# Patient Record
Sex: Female | Born: 1965 | Race: White | Hispanic: No | Marital: Married | Smoking: Never smoker
Health system: Southern US, Community
[De-identification: ages and names within clinical notes are randomized; demographics above are authoritative.]

## PROBLEM LIST (undated history)

## (undated) DIAGNOSIS — K219 Gastro-esophageal reflux disease without esophagitis: Secondary | ICD-10-CM

## (undated) DIAGNOSIS — Z09 Encounter for follow-up examination after completed treatment for conditions other than malignant neoplasm: Secondary | ICD-10-CM

## (undated) DIAGNOSIS — D499 Neoplasm of unspecified behavior of unspecified site: Secondary | ICD-10-CM

## (undated) DIAGNOSIS — L439 Lichen planus, unspecified: Secondary | ICD-10-CM

## (undated) DIAGNOSIS — R0602 Shortness of breath: Secondary | ICD-10-CM

## (undated) DIAGNOSIS — R600 Localized edema: Secondary | ICD-10-CM

## (undated) DIAGNOSIS — R030 Elevated blood-pressure reading, without diagnosis of hypertension: Secondary | ICD-10-CM

## (undated) DIAGNOSIS — R200 Anesthesia of skin: Secondary | ICD-10-CM

## (undated) DIAGNOSIS — E039 Hypothyroidism, unspecified: Secondary | ICD-10-CM

## (undated) DIAGNOSIS — F32A Depression, unspecified: Secondary | ICD-10-CM

## (undated) DIAGNOSIS — R0601 Orthopnea: Secondary | ICD-10-CM

## (undated) DIAGNOSIS — F329 Major depressive disorder, single episode, unspecified: Secondary | ICD-10-CM

## (undated) HISTORY — DX: Depression, unspecified: F32.A

## (undated) HISTORY — PX: TUBAL LIGATION: SHX77

## (undated) HISTORY — DX: Localized edema: R60.0

## (undated) HISTORY — DX: Orthopnea: R06.01

## (undated) HISTORY — DX: Neoplasm of unspecified behavior of unspecified site: D49.9

## (undated) HISTORY — DX: Anesthesia of skin: R20.0

## (undated) HISTORY — DX: Encounter for follow-up examination after completed treatment for conditions other than malignant neoplasm: Z09

## (undated) HISTORY — DX: Major depressive disorder, single episode, unspecified: F32.9

## (undated) HISTORY — DX: Elevated blood-pressure reading, without diagnosis of hypertension: R03.0

## (undated) HISTORY — DX: Hypothyroidism, unspecified: E03.9

## (undated) HISTORY — DX: Lichen planus, unspecified: L43.9

## (undated) HISTORY — DX: Shortness of breath: R06.02

## (undated) HISTORY — DX: Gastro-esophageal reflux disease without esophagitis: K21.9

---

## 2016-01-29 DIAGNOSIS — F4310 Post-traumatic stress disorder, unspecified: Secondary | ICD-10-CM | POA: Diagnosis not present

## 2016-02-05 DIAGNOSIS — F331 Major depressive disorder, recurrent, moderate: Secondary | ICD-10-CM | POA: Diagnosis not present

## 2016-02-16 ENCOUNTER — Ambulatory Visit (INDEPENDENT_AMBULATORY_CARE_PROVIDER_SITE_OTHER): Payer: BLUE CROSS/BLUE SHIELD | Admitting: Family Medicine

## 2016-02-16 VITALS — BP 142/88 | HR 72 | Temp 98.6°F | Resp 16 | Ht 62.0 in | Wt 151.4 lb

## 2016-02-16 DIAGNOSIS — Z0000 Encounter for general adult medical examination without abnormal findings: Secondary | ICD-10-CM | POA: Diagnosis not present

## 2016-02-16 DIAGNOSIS — Z131 Encounter for screening for diabetes mellitus: Secondary | ICD-10-CM | POA: Diagnosis not present

## 2016-02-16 DIAGNOSIS — Z23 Encounter for immunization: Secondary | ICD-10-CM | POA: Diagnosis not present

## 2016-02-16 DIAGNOSIS — Z1211 Encounter for screening for malignant neoplasm of colon: Secondary | ICD-10-CM

## 2016-02-16 DIAGNOSIS — N898 Other specified noninflammatory disorders of vagina: Secondary | ICD-10-CM

## 2016-02-16 DIAGNOSIS — Z13220 Encounter for screening for lipoid disorders: Secondary | ICD-10-CM | POA: Diagnosis not present

## 2016-02-16 DIAGNOSIS — F329 Major depressive disorder, single episode, unspecified: Secondary | ICD-10-CM | POA: Diagnosis not present

## 2016-02-16 DIAGNOSIS — N939 Abnormal uterine and vaginal bleeding, unspecified: Secondary | ICD-10-CM

## 2016-02-16 DIAGNOSIS — Z01411 Encounter for gynecological examination (general) (routine) with abnormal findings: Secondary | ICD-10-CM | POA: Diagnosis not present

## 2016-02-16 DIAGNOSIS — Z803 Family history of malignant neoplasm of breast: Secondary | ICD-10-CM | POA: Diagnosis not present

## 2016-02-16 DIAGNOSIS — Z114 Encounter for screening for human immunodeficiency virus [HIV]: Secondary | ICD-10-CM

## 2016-02-16 DIAGNOSIS — K219 Gastro-esophageal reflux disease without esophagitis: Secondary | ICD-10-CM | POA: Diagnosis not present

## 2016-02-16 LAB — POCT URINALYSIS DIP (MANUAL ENTRY)
BILIRUBIN UA: NEGATIVE
BILIRUBIN UA: NEGATIVE
Glucose, UA: NEGATIVE
LEUKOCYTES UA: NEGATIVE
NITRITE UA: NEGATIVE
Protein Ur, POC: NEGATIVE
Spec Grav, UA: 1.02
Urobilinogen, UA: 0.2
pH, UA: 6.5

## 2016-02-16 LAB — POCT WET + KOH PREP
TRICH BY WET PREP: ABSENT
Yeast by KOH: ABSENT
Yeast by wet prep: ABSENT

## 2016-02-16 NOTE — Progress Notes (Signed)
Subjective:    Patient ID: Laura Lawson, female    DOB: Jan 07, 1966, 51 y.o.   MRN: [id number]  02/16/2016  Annual Exam (With pap) and Depression (PHQ-9 score of 18)   HPI This 51 y.o. female presents for Complete Physical Examination and to establish care.  Last physical: 2016 Pap smear: unsure; four years ago; no abnormal pap smears; regular menses. Bleeds 7 days. Mammogram:  4 years Colonoscopy:  Never; refuses; agreeable for hemosures Eye exam: none; readers Dental exam:  None in one years  Immunization History  Administered Date(s) Administered  . Influenza-Unspecified 10/21/2015  . Tdap 02/16/2016    Just started vitamins for depression; lacking motivation. Has started B6 and Omega fish oils.  Chronic depression yet seems to be struggling more recently; would like hormone levels checked because knows must be perimenopausal.  Lacking motivation to exercise which is usually effective treatment for depression for patient.   Review of Systems  Constitutional: Negative for activity change, appetite change, chills, diaphoresis, fatigue, fever and unexpected weight change.  HENT: Negative for congestion, dental problem, drooling, ear discharge, ear pain, facial swelling, hearing loss, mouth sores, nosebleeds, postnasal drip, rhinorrhea, sinus pressure, sneezing, sore throat, tinnitus, trouble swallowing and voice change.   Eyes: Negative for photophobia, pain, discharge, redness, itching and visual disturbance.  Respiratory: Negative for apnea, cough, choking, chest tightness, shortness of breath, wheezing and stridor.   Cardiovascular: Negative for chest pain, palpitations and leg swelling.  Gastrointestinal: Negative for abdominal distention, abdominal pain, anal bleeding, blood in stool, constipation, diarrhea, nausea, rectal pain and vomiting.  Endocrine: Negative for cold intolerance, heat intolerance, polydipsia, polyphagia and polyuria.  Genitourinary: Positive for vaginal  discharge. Negative for decreased urine volume, difficulty urinating, dyspareunia, dysuria, enuresis, flank pain, frequency, genital sores, hematuria, menstrual problem, pelvic pain, urgency, vaginal bleeding and vaginal pain.  Musculoskeletal: Negative for arthralgias, back pain, gait problem, joint swelling, myalgias, neck pain and neck stiffness.  Skin: Negative for color change, pallor, rash and wound.  Allergic/Immunologic: Negative for environmental allergies, food allergies and immunocompromised state.  Neurological: Negative for dizziness, tremors, seizures, syncope, facial asymmetry, speech difficulty, weakness, light-headedness, numbness and headaches.  Hematological: Negative for adenopathy. Does not bruise/bleed easily.  Psychiatric/Behavioral: Positive for dysphoric mood. Negative for agitation, behavioral problems, confusion, decreased concentration, hallucinations, self-injury, sleep disturbance and suicidal ideas. The patient is not nervous/anxious and is not hyperactive.     Past Medical History:  Diagnosis Date  . Depression    onset age 89.  Marland Kitchen GERD (gastroesophageal reflux disease)    Past Surgical History:  Procedure Laterality Date  . TUBAL LIGATION     No Known Allergies Current Outpatient Prescriptions  Medication Sig Dispense Refill  . omeprazole (PRILOSEC) 20 MG capsule Take 20 mg by mouth daily.     No current facility-administered medications for this visit.    Social History   Social History  . Marital status: Married    Spouse name: N/A  . Number of children: 2  . Years of education: N/A   Occupational History  . Medical Assistant    Social History Main Topics  . Smoking status: Former Research scientist (life sciences)  . Smokeless tobacco: Never Used  . Alcohol use Yes  . Drug use: Unknown  . Sexual activity: Not on file   Other Topics Concern  . Not on file   Social History Narrative   Marital status: married x 18 years      Children: 2 children (32, 42);  grandchildren 1 (61yo)  Lives: with son, husband; moved from [place]      Employment: Psychologist, sport and exercise at [employer]      Tobacco: none       Alcohol: 1 monthly      Drugs: none      Exercise: jogging   Family History  Problem Relation Age of Onset  . Hypertension Mother   . Depression Mother   . Heart disease Father     CAD/stenting  . Diabetes Father   . Hyperlipidemia Father   . Hypertension Father   . Depression Father   . Cancer Sister 62    Breast cancer       Objective:    BP (!) 142/88   Pulse 72   Temp 98.6 F (37 C) (Oral)   Resp 16   Ht '5\' 2"'$  (1.575 m)   Wt 151 lb 6.4 oz (68.7 kg)   LMP 01/10/2016   SpO2 100%   BMI 27.69 kg/m  Physical Exam  Constitutional: She is oriented to person, place, and time. She appears well-developed and well-nourished. No distress.  HENT:  Head: Normocephalic and atraumatic.  Right Ear: External ear normal.  Left Ear: External ear normal.  Nose: Nose normal.  Mouth/Throat: Oropharynx is clear and moist.  Eyes: Conjunctivae and EOM are normal. Pupils are equal, round, and reactive to light.  Neck: Normal range of motion and full passive range of motion without pain. Neck supple. No JVD present. Carotid bruit is not present. No thyromegaly present.  Cardiovascular: Normal rate, regular rhythm and normal heart sounds.  Exam reveals no gallop and no friction rub.   No murmur heard. Pulmonary/Chest: Effort normal and breath sounds normal. She has no wheezes. She has no rales. Right breast exhibits no inverted nipple, no mass, no nipple discharge, no skin change and no tenderness. Left breast exhibits no inverted nipple, no mass, no nipple discharge, no skin change and no tenderness. Breasts are symmetrical.  Abdominal: Soft. Bowel sounds are normal. She exhibits no distension and no mass. There is no tenderness. There is no rebound and no guarding.  Genitourinary: There is no rash, tenderness, lesion or injury on the  right labia. There is no rash, tenderness, lesion or injury on the left labia. Uterus is enlarged. Cervix exhibits friability. Cervix exhibits no motion tenderness and no discharge. Right adnexum displays no mass, no tenderness and no fullness. Left adnexum displays no mass, no tenderness and no fullness. Vaginal discharge found.  Musculoskeletal:       Right shoulder: Normal.       Left shoulder: Normal.       Cervical back: Normal.  Lymphadenopathy:    She has no cervical adenopathy.  Neurological: She is alert and oriented to person, place, and time. She has normal reflexes. No cranial nerve deficit. She exhibits normal muscle tone. Coordination normal.  Skin: Skin is warm and dry. No rash noted. She is not diaphoretic. No erythema. No pallor.  Psychiatric: She has a normal mood and affect. Her behavior is normal. Judgment and thought content normal.  Nursing note and vitals reviewed.   Depression screen PHQ 2/9 02/16/2016  Decreased Interest 3  Down, Depressed, Hopeless 3  PHQ - 2 Score 6  Altered sleeping 2  Tired, decreased energy 3  Change in appetite 1  Feeling bad or failure about yourself  2  Trouble concentrating 3  Moving slowly or fidgety/restless 1  Suicidal thoughts 0  PHQ-9 Score 18  Difficult doing work/chores  Somewhat difficult       Assessment & Plan:   1. Routine physical examination   2. Encounter for gynecological examination with abnormal finding   3. Vaginal bleeding, abnormal   4. Need for Tdap vaccination   5. Screening, lipid   6. Screening for diabetes mellitus   7. Vaginal discharge   8. Reactive depression   9. Screening for HIV (human immunodeficiency virus)   10. Colon cancer screening   11. Family history of breast cancer in first degree relative    -anticipatory guidance provided -- exercise, weight loss, 3 servings of calcium daily. -pap smear obtained. -pt declined referral for mammogram despite family hx of breast cancer in  sister -declined referral for colonoscopy but agreeable to completing home hemosure kit; provided. -s/p TDAP -obtain FSH/LH levels due to perimenopausal state -encourage regular exercise for treatment of depression; pt also recently started vitamin supplements; declined medication or psychotherapy at this time.   Orders Placed This Encounter  Procedures  . Tdap vaccine greater than or equal to 7yo IM  . CBC with Differential/Platelet  . Comprehensive metabolic panel    Order Specific Question:   Has the patient fasted?    Answer:   Yes  . Hemoglobin A1c  . Lipid panel    Order Specific Question:   Has the patient fasted?    Answer:   Yes  . TSH  . Vitamin B12  . VITAMIN D 25 Hydroxy (Vit-D Deficiency, Fractures)  . HIV antibody  . FSH/LH  . POC Hemoccult Bld/Stl (3-Cd Home Screen)    Standing Status:   Future    Standing Expiration Date:   02/15/2017  . POCT urinalysis dipstick  . POCT Wet + KOH Prep   Meds ordered this encounter  Medications  . omeprazole (PRILOSEC) 20 MG capsule    Sig: Take 20 mg by mouth daily.    No Follow-up on file.   Edmund Rick Elayne Guerin, M.D. Primary [facility] at [facility] [address] [place], [place]  [address] [phone] phone [phone] fax

## 2016-02-16 NOTE — Patient Instructions (Addendum)
   IF you received an x-ray today, you will receive an invoice from [facility]. Please contact [facility] at [phone] with questions or concerns regarding your invoice.   IF you received labwork today, you will receive an invoice from [facility]. Please contact [facility] at [phone] with questions or concerns regarding your invoice.   Our billing staff will not be able to assist you with questions regarding bills from these companies.  You will be contacted with the lab results as soon as they are available. The fastest way to get your results is to activate your My Chart account. Instructions are located on the last page of this paperwork. If you have not heard from us regarding the results in 2 weeks, please contact this office.     Keeping You Healthy  Get These Tests  Blood Pressure- Have your blood pressure checked by your healthcare provider at least once a year.  Normal blood pressure is 120/80.  Weight- Have your body mass index (BMI) calculated to screen for obesity.  BMI is a measure of body fat based on height and weight.  You can calculate your own BMI at www.nhlbisupport.com/bmi/  Cholesterol- Have your cholesterol checked every year.  Diabetes- Have your blood sugar checked every year if you have high blood pressure, high cholesterol, a family history of diabetes or if you are overweight.  Pap Test - Have a pap test every 1 to 5 years if you have been sexually active.  If you are older than 65 and recent pap tests have been normal you may not need additional pap tests.  In addition, if you have had a hysterectomy  for benign disease additional pap tests are not necessary.  Mammogram-Yearly mammograms are essential for early detection of breast cancer  Screening for Colon Cancer- Colonoscopy starting at age 50. Screening may begin sooner depending on your family history and other health conditions.  Follow up colonoscopy as directed by your  Gastroenterologist.  Screening for Osteoporosis- Screening begins at age 65 with bone density scanning, sooner if you are at higher risk for developing Osteoporosis.  Get these medicines  Calcium with Vitamin D- Your body requires 1200-1500 mg of Calcium a day and 800-1000 IU of Vitamin D a day.  You can only absorb 500 mg of Calcium at a time therefore Calcium must be taken in 2 or 3 separate doses throughout the day.  Hormones- Hormone therapy has been associated with increased risk for certain cancers and heart disease.  Talk to your healthcare provider about if you need relief from menopausal symptoms.  Aspirin- Ask your healthcare provider about taking Aspirin to prevent Heart Disease and Stroke.  Get these Immuniztions  Flu shot- Every fall  Pneumonia shot- Once after the age of 65; if you are younger ask your healthcare provider if you need a pneumonia shot.  Tetanus- Every ten years.  Zostavax- Once after the age of 60 to prevent shingles.  Take these steps  Don't smoke- Your healthcare provider can help you quit. For tips on how to quit, ask your healthcare provider or go to www.smokefree.gov or call 1-800 QUIT-NOW.  Be physically active- Exercise 5 days a week for a minimum of 30 minutes.  If you are not already physically active, start slow and gradually work up to 30 minutes of moderate physical activity.  Try walking, dancing, bike riding, swimming, etc.  Eat a healthy diet- Eat a variety of healthy foods such as fruits, vegetables, whole grains, low   fat milk, low fat cheeses, yogurt, lean meats, chicken, fish, eggs, dried beans, tofu, etc.  For more information go to www.thenutritionsource.org  Dental visit- Brush and floss teeth twice daily; visit your dentist twice a year.  Eye exam- Visit your Optometrist or Ophthalmologist yearly.  Drink alcohol in moderation- Limit alcohol intake to one drink or less a day.  Never drink and drive.  Depression- Your emotional  health is as important as your physical health.  If you're feeling down or losing interest in things you normally enjoy, please talk to your healthcare provider.  Seat Belts- can save your life; always wear one  Smoke/Carbon Monoxide detectors- These detectors need to be installed on the appropriate level of your home.  Replace batteries at least once a year.  Violence- If anyone is threatening or hurting you, please tell your healthcare provider.  Living Will/ Health care power of attorney- Discuss with your healthcare provider and family.  

## 2016-02-17 LAB — FSH/LH
FSH: 3.9 m[IU]/mL
LH: 3.2 m[IU]/mL

## 2016-02-18 DIAGNOSIS — K219 Gastro-esophageal reflux disease without esophagitis: Secondary | ICD-10-CM | POA: Insufficient documentation

## 2016-02-18 DIAGNOSIS — Z803 Family history of malignant neoplasm of breast: Secondary | ICD-10-CM | POA: Insufficient documentation

## 2016-02-18 DIAGNOSIS — F329 Major depressive disorder, single episode, unspecified: Secondary | ICD-10-CM | POA: Insufficient documentation

## 2016-02-18 LAB — CBC WITH DIFFERENTIAL/PLATELET
BASOS ABS: 0 10*3/uL (ref 0.0–0.2)
Basos: 1 %
EOS (ABSOLUTE): 0.2 10*3/uL (ref 0.0–0.4)
Eos: 2 %
Hematocrit: 36.9 % (ref 34.0–46.6)
Hemoglobin: 11.9 g/dL (ref 11.1–15.9)
IMMATURE GRANULOCYTES: 0 %
Immature Grans (Abs): 0 10*3/uL (ref 0.0–0.1)
Lymphocytes Absolute: 2.7 10*3/uL (ref 0.7–3.1)
Lymphs: 31 %
MCH: 24.4 pg — ABNORMAL LOW (ref 26.6–33.0)
MCHC: 32.2 g/dL (ref 31.5–35.7)
MCV: 76 fL — ABNORMAL LOW (ref 79–97)
MONOCYTES: 6 %
MONOS ABS: 0.5 10*3/uL (ref 0.1–0.9)
NEUTROS PCT: 60 %
Neutrophils Absolute: 5.2 10*3/uL (ref 1.4–7.0)
Platelets: 327 10*3/uL (ref 150–379)
RBC: 4.88 x10E6/uL (ref 3.77–5.28)
RDW: 16.1 % — AB (ref 12.3–15.4)
WBC: 8.7 10*3/uL (ref 3.4–10.8)

## 2016-02-18 LAB — COMPREHENSIVE METABOLIC PANEL
ALK PHOS: 195 IU/L — AB (ref 39–117)
ALT: 75 IU/L — AB (ref 0–32)
AST: 65 IU/L — AB (ref 0–40)
Albumin/Globulin Ratio: 1.4 (ref 1.2–2.2)
Albumin: 4.5 g/dL (ref 3.5–5.5)
BUN/Creatinine Ratio: 18 (ref 9–23)
BUN: 14 mg/dL (ref 6–24)
Bilirubin Total: 0.5 mg/dL (ref 0.0–1.2)
CALCIUM: 9.7 mg/dL (ref 8.7–10.2)
CO2: 21 mmol/L (ref 18–29)
CREATININE: 0.76 mg/dL (ref 0.57–1.00)
Chloride: 97 mmol/L (ref 96–106)
GFR calc Af Amer: 105 mL/min/{1.73_m2} (ref >59)
GFR, EST NON AFRICAN AMERICAN: 91 mL/min/{1.73_m2} (ref >59)
GLUCOSE: 77 mg/dL (ref 65–99)
Globulin, Total: 3.2 g/dL (ref 1.5–4.5)
Potassium: 4.1 mmol/L (ref 3.5–5.2)
SODIUM: 138 mmol/L (ref 134–144)
Total Protein: 7.7 g/dL (ref 6.0–8.5)

## 2016-02-18 LAB — LIPID PANEL
CHOLESTEROL TOTAL: 252 mg/dL — AB (ref 100–199)
Chol/HDL Ratio: 3.3 ratio units (ref 0.0–4.4)
HDL: 76 mg/dL (ref >39)
LDL Calculated: 147 mg/dL — ABNORMAL HIGH (ref 0–99)
TRIGLYCERIDES: 144 mg/dL (ref 0–149)
VLDL CHOLESTEROL CAL: 29 mg/dL (ref 5–40)

## 2016-02-18 LAB — VITAMIN B12: Vitamin B-12: 996 pg/mL (ref 232–1245)

## 2016-02-18 LAB — TSH: TSH: 4.26 u[IU]/mL (ref 0.450–4.500)

## 2016-02-18 LAB — HEMOGLOBIN A1C
ESTIMATED AVERAGE GLUCOSE: 91 mg/dL
HEMOGLOBIN A1C: 4.8 % (ref 4.8–5.6)

## 2016-02-18 LAB — VITAMIN D 25 HYDROXY (VIT D DEFICIENCY, FRACTURES): Vit D, 25-Hydroxy: 26 ng/mL — ABNORMAL LOW (ref 30.0–100.0)

## 2016-02-18 LAB — HIV ANTIBODY (ROUTINE TESTING W REFLEX): HIV Screen 4th Generation wRfx: NONREACTIVE

## 2016-02-19 DIAGNOSIS — F4310 Post-traumatic stress disorder, unspecified: Secondary | ICD-10-CM | POA: Diagnosis not present

## 2016-02-21 LAB — PAP IG, CT-NG NAA, HPV HIGH-RISK
Chlamydia, Nuc. Acid Amp: NEGATIVE
Gonococcus by Nucleic Acid Amp: NEGATIVE
HPV, HIGH-RISK: NEGATIVE
PAP Smear Comment: 0

## 2016-02-22 NOTE — Addendum Note (Signed)
Addended by: Burnis Kingfisher on: 02/22/2016 04:44 PM   Modules accepted: Orders

## 2016-03-04 DIAGNOSIS — F4310 Post-traumatic stress disorder, unspecified: Secondary | ICD-10-CM | POA: Diagnosis not present

## 2016-03-18 DIAGNOSIS — F331 Major depressive disorder, recurrent, moderate: Secondary | ICD-10-CM | POA: Diagnosis not present

## 2016-04-10 DIAGNOSIS — F331 Major depressive disorder, recurrent, moderate: Secondary | ICD-10-CM | POA: Diagnosis not present

## 2016-05-15 DIAGNOSIS — F331 Major depressive disorder, recurrent, moderate: Secondary | ICD-10-CM | POA: Diagnosis not present

## 2016-07-01 DIAGNOSIS — F3342 Major depressive disorder, recurrent, in full remission: Secondary | ICD-10-CM | POA: Diagnosis not present

## 2016-09-12 DIAGNOSIS — F3342 Major depressive disorder, recurrent, in full remission: Secondary | ICD-10-CM | POA: Diagnosis not present

## 2016-09-17 DIAGNOSIS — R0789 Other chest pain: Secondary | ICD-10-CM | POA: Diagnosis not present

## 2016-09-17 DIAGNOSIS — D508 Other iron deficiency anemias: Secondary | ICD-10-CM | POA: Diagnosis not present

## 2016-09-17 DIAGNOSIS — R5383 Other fatigue: Secondary | ICD-10-CM | POA: Diagnosis not present

## 2017-03-06 DIAGNOSIS — F900 Attention-deficit hyperactivity disorder, predominantly inattentive type: Secondary | ICD-10-CM | POA: Diagnosis not present

## 2017-03-06 DIAGNOSIS — F3342 Major depressive disorder, recurrent, in full remission: Secondary | ICD-10-CM | POA: Diagnosis not present

## 2017-04-08 ENCOUNTER — Encounter: Payer: BLUE CROSS/BLUE SHIELD | Admitting: Family Medicine

## 2017-06-15 ENCOUNTER — Encounter: Payer: Self-pay | Admitting: Family Medicine

## 2017-08-28 DIAGNOSIS — F900 Attention-deficit hyperactivity disorder, predominantly inattentive type: Secondary | ICD-10-CM | POA: Diagnosis not present

## 2018-02-19 DIAGNOSIS — F900 Attention-deficit hyperactivity disorder, predominantly inattentive type: Secondary | ICD-10-CM | POA: Diagnosis not present

## 2018-08-04 DIAGNOSIS — Z20828 Contact with and (suspected) exposure to other viral communicable diseases: Secondary | ICD-10-CM | POA: Diagnosis not present

## 2018-08-20 DIAGNOSIS — F900 Attention-deficit hyperactivity disorder, predominantly inattentive type: Secondary | ICD-10-CM | POA: Diagnosis not present

## 2018-10-19 DIAGNOSIS — L821 Other seborrheic keratosis: Secondary | ICD-10-CM | POA: Diagnosis not present

## 2018-10-19 DIAGNOSIS — L439 Lichen planus, unspecified: Secondary | ICD-10-CM | POA: Diagnosis not present

## 2018-11-30 DIAGNOSIS — Z23 Encounter for immunization: Secondary | ICD-10-CM | POA: Diagnosis not present

## 2018-11-30 DIAGNOSIS — L810 Postinflammatory hyperpigmentation: Secondary | ICD-10-CM | POA: Diagnosis not present

## 2018-11-30 DIAGNOSIS — Z205 Contact with and (suspected) exposure to viral hepatitis: Secondary | ICD-10-CM | POA: Diagnosis not present

## 2020-05-29 ENCOUNTER — Other Ambulatory Visit: Payer: Self-pay | Admitting: Otolaryngology

## 2020-05-29 DIAGNOSIS — J329 Chronic sinusitis, unspecified: Secondary | ICD-10-CM

## 2020-06-09 ENCOUNTER — Ambulatory Visit
Admission: RE | Admit: 2020-06-09 | Discharge: 2020-06-09 | Disposition: A | Discharge status: Home / Self Care | Payer: Managed Care, Other (non HMO) | Source: Ambulatory Visit | Attending: Otolaryngology | Admitting: Otolaryngology

## 2020-06-09 ENCOUNTER — Other Ambulatory Visit: Payer: Self-pay

## 2020-06-09 DIAGNOSIS — J329 Chronic sinusitis, unspecified: Secondary | ICD-10-CM

## 2020-06-29 ENCOUNTER — Other Ambulatory Visit: Payer: Self-pay | Admitting: Otolaryngology

## 2020-06-29 DIAGNOSIS — R200 Anesthesia of skin: Secondary | ICD-10-CM

## 2020-06-29 DIAGNOSIS — G500 Trigeminal neuralgia: Secondary | ICD-10-CM

## 2020-07-14 ENCOUNTER — Other Ambulatory Visit: Payer: Self-pay

## 2020-07-14 ENCOUNTER — Ambulatory Visit
Admission: RE | Admit: 2020-07-14 | Discharge: 2020-07-14 | Disposition: A | Discharge status: Home / Self Care | Payer: Managed Care, Other (non HMO) | Source: Ambulatory Visit | Attending: Otolaryngology | Admitting: Otolaryngology

## 2020-07-14 DIAGNOSIS — G500 Trigeminal neuralgia: Secondary | ICD-10-CM

## 2020-07-14 DIAGNOSIS — R200 Anesthesia of skin: Secondary | ICD-10-CM

## 2020-07-14 MED ORDER — GADOBENATE DIMEGLUMINE 529 MG/ML IV SOLN
13.0000 mL | Freq: Once | INTRAVENOUS | Status: AC | PRN
  Administered 2020-07-14: 13 mL via INTRAVENOUS

## 2021-05-07 ENCOUNTER — Telehealth: Payer: Self-pay

## 2021-05-07 NOTE — Telephone Encounter (Signed)
NOTES SCANNED TO REFERRAL 

## 2021-05-31 NOTE — Progress Notes (Signed)
? ?Chief Complaint  ?Patient presents with  ? New Patient (Initial Visit)  ?  Dyspnea  ? ? ?History of Present Illness: 56 yo female with history of ADHD, hypothyroidism due to Hashimoto's thyroiditis,GERD and depression here today as a new patient for the evaluation of dyspnea and lower extremity edema. BNP several weeks ago in primary care was 19. She tells me today that she has had dyspnea with exertion for several months. She has to sleep on several pillows. She notices sharp chest pains when laying in bed. She has no dizziness. Some ankle edema.  ? ?Primary Care Physician: Willeen Niece, PA  ? ? ?Past Medical History:  ?Diagnosis Date  ? Depression   ? onset age 67.  ? Elevated BP without diagnosis of hypertension   ? Encounter for hematology follow-up   ? GERD (gastroesophageal reflux disease)   ? Hypothyroidism   ? DUE TO HASHIMOTO THYROIDITIS  ? Lichen planus   ? Lower extremity edema   ? Neoplasia   ? Numbness and tingling   ? Orthopnea   ? SOB (shortness of breath) on exertion   ? ? ?Past Surgical History:  ?Procedure Laterality Date  ? TUBAL LIGATION    ? ? ?Current Outpatient Medications  ?Medication Sig Dispense Refill  ? levothyroxine (SYNTHROID) 50 MCG tablet Take 50 mcg by mouth daily before breakfast.    ? omeprazole (PRILOSEC) 20 MG capsule Take 20 mg by mouth as needed.    ? ?No current facility-administered medications for this visit.  ? ? ?Allergies  ?Allergen Reactions  ? Sulfa Antibiotics Rash  ?  HIVES, ITCHING   ? ? ?Social History  ? ?Socioeconomic History  ? Marital status: Married  ?  Spouse name: Not on file  ? Number of children: 2  ? Years of education: Not on file  ? Highest education level: Not on file  ?Occupational History  ? Occupation: Psychologist, sport and exercise  ? Occupation: Psychologist, sport and exercise  ?Tobacco Use  ? Smoking status: Never  ? Smokeless tobacco: Never  ?Substance and Sexual Activity  ? Alcohol use: Yes  ?  Comment: Social  ? Drug use: Never  ? Sexual activity: Not on file   ?Other Topics Concern  ? Not on file  ?Social History Narrative  ? Marital status: married x 18 years  ?    Children: 2 children (32, 38); grandchildren 1 (52yo)  ?    Lives: with son, husband; moved from [place]  ?    Employment: Psychologist, sport and exercise at [employer]  ?    Tobacco: none  ?     Alcohol: 1 monthly  ?    Drugs: none  ?    Exercise: jogging  ? ?Social Determinants of Health  ? ?Financial Resource Strain: Not on file  ?Food Insecurity: Not on file  ?Transportation Needs: Not on file  ?Physical Activity: Not on file  ?Stress: Not on file  ?Social Connections: Not on file  ?Intimate Partner Violence: Not on file  ? ? ?Family History  ?Problem Relation Age of Onset  ? Hypertension Mother   ? Depression Mother   ? Heart disease Father   ?     CAD/stenting  ? Diabetes Father   ? Hyperlipidemia Father   ? Hypertension Father   ? Depression Father   ? Cancer Sister 60  ?     Breast cancer  ? ? ?Review of Systems:  As stated in the HPI and otherwise negative.  ? ?  BP 122/80   Pulse 61   Ht '5\' 2"'$  (1.575 m)   Wt 160 lb (72.6 kg)   SpO2 99%   BMI 29.26 kg/m?  ? ?Physical Examination: ?General: Well developed, well nourished, NAD  ?HEENT: OP clear, mucus membranes moist  ?SKIN: warm, dry. No rashes. ?Neuro: No focal deficits  ?Musculoskeletal: Muscle strength 5/5 all ext  ?Psychiatric: Mood and affect normal  ?Neck: No JVD, no carotid bruits, no thyromegaly, no lymphadenopathy.  ?Lungs:Clear bilaterally, no wheezes, rhonci, crackles ?Cardiovascular: Regular rate and rhythm. No murmurs, gallops or rubs. ?Abdomen:Soft. Bowel sounds present. Non-tender.  ?Extremities: No lower extremity edema. Pulses are 2 + in the bilateral DP/PT. ? ?EKG:  EKG is ordered today. ?The ekg ordered today demonstrates sinus with non-specific T wave abn ? ?Recent Labs: ?No results found for requested labs within last 8760 hours.  ? ?Lipid Panel ?   ?Component Value Date/Time  ? CHOL 252 (H) 02/16/2016 1502  ? TRIG 144 02/16/2016  1502  ? HDL 76 02/16/2016 1502  ? CHOLHDL 3.3 02/16/2016 1502  ? LDLCALC 147 (H) 02/16/2016 1502  ? ?  ?Wt Readings from Last 3 Encounters:  ?06/03/21 160 lb (72.6 kg)  ?02/16/16 151 lb 6.4 oz (68.7 kg)  ?  ? ?Assessment and Plan:  ? ?1. Dyspnea: She has mild dyspnea when lying in bed. No dyspnea with exertion or chest pain. No volume overload on exam. BNP was normal last month. Will arrange an echo to assess LVEF and exclude structural heart disease. I do not think an ischemia workup is indicated.  ? ?Labs/ tests ordered today include:  ? ?Orders Placed This Encounter  ?Procedures  ? EKG 12-Lead  ? ECHOCARDIOGRAM COMPLETE  ? ?Disposition:   F/U with me as needed.  ? ? ?Signed, ?Lauree Chandler, MD ?06/03/2021 9:07 AM    ?[facility] ?[place], [place], [place]  [address] ?Phone: [phone]; Fax: [phone]  ? ? ?

## 2021-06-03 ENCOUNTER — Ambulatory Visit: Payer: Managed Care, Other (non HMO) | Admitting: Cardiovascular Disease

## 2021-06-03 ENCOUNTER — Encounter: Payer: Self-pay | Admitting: Cardiovascular Disease

## 2021-06-03 VITALS — BP 122/80 | HR 61 | Ht 62.0 in | Wt 160.0 lb

## 2021-06-03 DIAGNOSIS — R0602 Shortness of breath: Secondary | ICD-10-CM

## 2021-06-03 NOTE — Patient Instructions (Signed)
Medication Instructions:  No changes *If you need a refill on your cardiac medications before your next appointment, please call your pharmacy*   Lab Work: none If you have labs (blood work) drawn today and your tests are completely normal, you will receive your results only by: MyChart Message (if you have MyChart) OR A paper copy in the mail If you have any lab test that is abnormal or we need to change your treatment, we will call you to review the results.   Testing/Procedures: Your physician has requested that you have an echocardiogram. Echocardiography is a painless test that uses sound waves to create images of your heart. It provides your doctor with information about the size and shape of your heart and how well your heart's chambers and valves are working. This procedure takes approximately one hour. There are no restrictions for this procedure.   Follow-Up: As needed.   Important Information About Sugar       

## 2021-06-07 ENCOUNTER — Ambulatory Visit (HOSPITAL_COMMUNITY): Payer: Managed Care, Other (non HMO) | Attending: Cardiology

## 2021-06-07 DIAGNOSIS — R0600 Dyspnea, unspecified: Secondary | ICD-10-CM | POA: Diagnosis not present

## 2021-06-07 DIAGNOSIS — R0602 Shortness of breath: Secondary | ICD-10-CM | POA: Diagnosis not present

## 2021-06-07 DIAGNOSIS — I081 Rheumatic disorders of both mitral and tricuspid valves: Secondary | ICD-10-CM | POA: Diagnosis not present

## 2021-06-07 DIAGNOSIS — I517 Cardiomegaly: Secondary | ICD-10-CM | POA: Diagnosis not present

## 2021-06-07 LAB — ECHOCARDIOGRAM COMPLETE
Area-P 1/2: 4.17 cm2
S' Lateral: 2.7 cm

## 2021-12-19 ENCOUNTER — Ambulatory Visit: Payer: Managed Care, Other (non HMO) | Admitting: Family Medicine

## 2022-01-12 ENCOUNTER — Emergency Department (HOSPITAL_COMMUNITY): Payer: Managed Care, Other (non HMO)

## 2022-01-12 ENCOUNTER — Inpatient Hospital Stay (HOSPITAL_COMMUNITY)
Admission: EM | Admit: 2022-01-12 | Discharge: 2022-01-20 | DRG: 208 | Disposition: E | Payer: Managed Care, Other (non HMO) | Attending: Internal Medicine | Admitting: Internal Medicine

## 2022-01-12 ENCOUNTER — Encounter (HOSPITAL_COMMUNITY): Payer: Self-pay

## 2022-01-12 ENCOUNTER — Other Ambulatory Visit: Payer: Self-pay

## 2022-01-12 DIAGNOSIS — I444 Left anterior fascicular block: Secondary | ICD-10-CM | POA: Diagnosis present

## 2022-01-12 DIAGNOSIS — R55 Syncope and collapse: Secondary | ICD-10-CM | POA: Diagnosis not present

## 2022-01-12 DIAGNOSIS — K219 Gastro-esophageal reflux disease without esophagitis: Secondary | ICD-10-CM | POA: Diagnosis present

## 2022-01-12 DIAGNOSIS — R4781 Slurred speech: Secondary | ICD-10-CM | POA: Diagnosis not present

## 2022-01-12 DIAGNOSIS — R001 Bradycardia, unspecified: Secondary | ICD-10-CM | POA: Diagnosis present

## 2022-01-12 DIAGNOSIS — R7989 Other specified abnormal findings of blood chemistry: Secondary | ICD-10-CM | POA: Diagnosis present

## 2022-01-12 DIAGNOSIS — I728 Aneurysm of other specified arteries: Secondary | ICD-10-CM | POA: Diagnosis present

## 2022-01-12 DIAGNOSIS — F32A Depression, unspecified: Secondary | ICD-10-CM | POA: Diagnosis present

## 2022-01-12 DIAGNOSIS — U071 COVID-19: Secondary | ICD-10-CM | POA: Diagnosis not present

## 2022-01-12 DIAGNOSIS — K7200 Acute and subacute hepatic failure without coma: Secondary | ICD-10-CM | POA: Diagnosis not present

## 2022-01-12 DIAGNOSIS — Z803 Family history of malignant neoplasm of breast: Secondary | ICD-10-CM

## 2022-01-12 DIAGNOSIS — Z818 Family history of other mental and behavioral disorders: Secondary | ICD-10-CM

## 2022-01-12 DIAGNOSIS — Z8249 Family history of ischemic heart disease and other diseases of the circulatory system: Secondary | ICD-10-CM

## 2022-01-12 DIAGNOSIS — R630 Anorexia: Secondary | ICD-10-CM | POA: Diagnosis present

## 2022-01-12 DIAGNOSIS — Z833 Family history of diabetes mellitus: Secondary | ICD-10-CM

## 2022-01-12 DIAGNOSIS — I959 Hypotension, unspecified: Secondary | ICD-10-CM | POA: Diagnosis present

## 2022-01-12 DIAGNOSIS — R61 Generalized hyperhidrosis: Secondary | ICD-10-CM | POA: Diagnosis present

## 2022-01-12 DIAGNOSIS — R7401 Elevation of levels of liver transaminase levels: Secondary | ICD-10-CM | POA: Diagnosis present

## 2022-01-12 DIAGNOSIS — Z882 Allergy status to sulfonamides status: Secondary | ICD-10-CM

## 2022-01-12 DIAGNOSIS — Z79890 Hormone replacement therapy (postmenopausal): Secondary | ICD-10-CM

## 2022-01-12 DIAGNOSIS — N179 Acute kidney failure, unspecified: Secondary | ICD-10-CM | POA: Diagnosis not present

## 2022-01-12 DIAGNOSIS — A4189 Other specified sepsis: Secondary | ICD-10-CM | POA: Diagnosis present

## 2022-01-12 DIAGNOSIS — I469 Cardiac arrest, cause unspecified: Secondary | ICD-10-CM

## 2022-01-12 DIAGNOSIS — A0839 Other viral enteritis: Secondary | ICD-10-CM | POA: Diagnosis present

## 2022-01-12 DIAGNOSIS — R197 Diarrhea, unspecified: Secondary | ICD-10-CM | POA: Diagnosis present

## 2022-01-12 DIAGNOSIS — E872 Acidosis, unspecified: Secondary | ICD-10-CM | POA: Diagnosis present

## 2022-01-12 DIAGNOSIS — J40 Bronchitis, not specified as acute or chronic: Secondary | ICD-10-CM | POA: Diagnosis present

## 2022-01-12 DIAGNOSIS — E860 Dehydration: Secondary | ICD-10-CM | POA: Diagnosis present

## 2022-01-12 DIAGNOSIS — E063 Autoimmune thyroiditis: Secondary | ICD-10-CM | POA: Diagnosis present

## 2022-01-12 DIAGNOSIS — Z83438 Family history of other disorder of lipoprotein metabolism and other lipidemia: Secondary | ICD-10-CM

## 2022-01-12 LAB — CBC WITH DIFFERENTIAL/PLATELET
Abs Immature Granulocytes: 0.03 10*3/uL (ref 0.00–0.07)
Basophils Absolute: 0 10*3/uL (ref 0.0–0.1)
Basophils Relative: 0 %
Eosinophils Absolute: 0 10*3/uL (ref 0.0–0.5)
Eosinophils Relative: 0 %
HCT: 48.5 % — ABNORMAL HIGH (ref 36.0–46.0)
Hemoglobin: 16 g/dL — ABNORMAL HIGH (ref 12.0–15.0)
Immature Granulocytes: 1 %
Lymphocytes Relative: 9 %
Lymphs Abs: 0.6 10*3/uL — ABNORMAL LOW (ref 0.7–4.0)
MCH: 28.3 pg (ref 26.0–34.0)
MCHC: 33 g/dL (ref 30.0–36.0)
MCV: 85.7 fL (ref 80.0–100.0)
Monocytes Absolute: 0.5 10*3/uL (ref 0.1–1.0)
Monocytes Relative: 8 %
Neutro Abs: 5.3 10*3/uL (ref 1.7–7.7)
Neutrophils Relative %: 82 %
Platelets: 204 10*3/uL (ref 150–400)
RBC: 5.66 MIL/uL — ABNORMAL HIGH (ref 3.87–5.11)
RDW: 14.8 % (ref 11.5–15.5)
WBC: 6.5 10*3/uL (ref 4.0–10.5)
nRBC: 0 % (ref 0.0–0.2)

## 2022-01-12 LAB — CBG MONITORING, ED: Glucose-Capillary: 138 mg/dL — ABNORMAL HIGH (ref 70–99)

## 2022-01-12 LAB — TROPONIN I (HIGH SENSITIVITY): Troponin I (High Sensitivity): 52 ng/L — ABNORMAL HIGH (ref <18)

## 2022-01-12 LAB — LIPASE, BLOOD: Lipase: 49 U/L (ref 11–51)

## 2022-01-12 LAB — COMPREHENSIVE METABOLIC PANEL
ALT: 130 U/L — ABNORMAL HIGH (ref 0–44)
AST: 106 U/L — ABNORMAL HIGH (ref 15–41)
Albumin: 3.1 g/dL — ABNORMAL LOW (ref 3.5–5.0)
Alkaline Phosphatase: 199 U/L — ABNORMAL HIGH (ref 38–126)
Anion gap: 9 (ref 5–15)
BUN: 18 mg/dL (ref 6–20)
CO2: 22 mmol/L (ref 22–32)
Calcium: 8.4 mg/dL — ABNORMAL LOW (ref 8.9–10.3)
Chloride: 104 mmol/L (ref 98–111)
Creatinine, Ser: 1.1 mg/dL — ABNORMAL HIGH (ref 0.44–1.00)
GFR, Estimated: 59 mL/min — ABNORMAL LOW (ref >60)
Glucose, Bld: 121 mg/dL — ABNORMAL HIGH (ref 70–99)
Potassium: 3.8 mmol/L (ref 3.5–5.1)
Sodium: 135 mmol/L (ref 135–145)
Total Bilirubin: 0.8 mg/dL (ref 0.3–1.2)
Total Protein: 6.8 g/dL (ref 6.5–8.1)

## 2022-01-12 LAB — RESP PANEL BY RT-PCR (RSV, FLU A&B, COVID)  RVPGX2
Influenza A by PCR: NEGATIVE
Influenza B by PCR: NEGATIVE
Resp Syncytial Virus by PCR: NEGATIVE
SARS Coronavirus 2 by RT PCR: POSITIVE — AB

## 2022-01-12 LAB — D-DIMER, QUANTITATIVE: D-Dimer, Quant: 1.55 ug/mL-FEU — ABNORMAL HIGH (ref 0.00–0.50)

## 2022-01-12 MED ORDER — SODIUM CHLORIDE 0.9 % IV BOLUS
1000.0000 mL | Freq: Once | INTRAVENOUS | Status: AC
  Administered 2022-01-12: 1000 mL via INTRAVENOUS

## 2022-01-12 MED ORDER — ONDANSETRON HCL 4 MG/2ML IJ SOLN: 4.0000 mg | Freq: Four times a day (QID) | INTRAMUSCULAR | Status: DC | PRN

## 2022-01-12 MED ORDER — IOHEXOL 350 MG/ML SOLN
75.0000 mL | Freq: Once | INTRAVENOUS | Status: AC | PRN
  Administered 2022-01-12: 75 mL via INTRAVENOUS

## 2022-01-12 NOTE — Assessment & Plan Note (Signed)
Will need follow-up with vascular surgeon as an outpatient in 6 months Dr. Doren Custard is aware

## 2022-01-12 NOTE — Assessment & Plan Note (Signed)
Order Protonix 40 mg p.o. daily

## 2022-01-12 NOTE — Assessment & Plan Note (Signed)
In the setting of COVID.  Will obtain echogram.  If continues to rise will need official cardiology consult otherwise we will continue to monitor denies any chest pain no evidence of PE on CTA no ischemic changes on EKG

## 2022-01-12 NOTE — ED Provider Notes (Signed)
[facility] DEPT Provider Note   CSN: [id number] Arrival date & time: 01/07/2022  1848     History  No chief complaint on file.   Laura Lawson is a 56 y.o. female.  Patient with a history of hypothyroidism on Synthroid, COVID-positive as of 2 days ago recently started on Paxlovid.  Comes in today with a syncopal episode.  States she was sitting at home when she began to feel hot, sweaty, nauseated.  She attempted to go to the bathroom to have diarrhea but had a syncopal episode on the way.  Remembers feeling dizzy and lightheaded and like she is going to pass out.  Did not hit her head.  No chest pain or shortness of breath.  Does not know if she has had a fever but has had up-and-down temperatures to 101 over the past couple days.  Had several episodes of nonbloody diarrhea which is new today.  No vomiting.  No abdominal pain, chest pain or shortness of breath.  Denies any head or neck injury.  Denies any back pain.  No focal weakness, numbness or tingling.  No tongue biting or incontinence.  No history of previous syncope.  No cardiac history.  The history is provided by the patient and the EMS personnel.       Home Medications Prior to Admission medications   Medication Sig Start Date End Date Taking? Authorizing Provider  levothyroxine (SYNTHROID) 50 MCG tablet Take 50 mcg by mouth daily before breakfast.    [provider]  omeprazole (PRILOSEC) 20 MG capsule Take 20 mg by mouth as needed.    [provider]      Allergies    Sulfa antibiotics    Review of Systems   Review of Systems  Constitutional:  Positive for activity change, appetite change, fatigue and fever.  HENT:  Negative for congestion and rhinorrhea.   Respiratory:  Negative for shortness of breath.   Gastrointestinal:  Positive for diarrhea.  Genitourinary:  Negative for dysuria and hematuria.  Musculoskeletal:  Positive for arthralgias and myalgias.   Neurological:  Positive for dizziness, syncope, weakness and light-headedness.   all other systems are negative except as noted in the HPI and PMH.    Physical Exam Updated Vital Signs BP (!) 143/101   Pulse 84   Temp 98.1 F (36.7 C) (Oral)   Resp (!) 23   SpO2 97%  Physical Exam Vitals and nursing note reviewed.  Constitutional:      General: She is not in acute distress.    Appearance: She is well-developed.  HENT:     Head: Normocephalic and atraumatic.     Mouth/Throat:     Pharynx: No oropharyngeal exudate.  Eyes:     Conjunctiva/sclera: Conjunctivae normal.     Pupils: Pupils are equal, round, and reactive to light.  Neck:     Comments: No meningismus. Cardiovascular:     Rate and Rhythm: Normal rate and regular rhythm.     Heart sounds: Normal heart sounds. No murmur heard. Pulmonary:     Effort: Pulmonary effort is normal. No respiratory distress.     Breath sounds: Normal breath sounds.  Abdominal:     Palpations: Abdomen is soft.     Tenderness: There is no abdominal tenderness. There is no guarding or rebound.  Musculoskeletal:        General: No tenderness. Normal range of motion.     Cervical back: Normal range of motion and neck supple.  Skin:    General: Skin is warm.  Neurological:     Mental Status: She is alert and oriented to person, place, and time.     Cranial Nerves: No cranial nerve deficit.     Motor: No abnormal muscle tone.     Coordination: Coordination normal.     Comments:  5/5 strength throughout. CN 2-12 intact.Equal grip strength.   Psychiatric:        Behavior: Behavior normal.     ED Results / Procedures / Treatments   Labs (all labs ordered are listed, but only abnormal results are displayed) Labs Reviewed  RESP PANEL BY RT-PCR (RSV, FLU A&B, COVID)  RVPGX2 - Abnormal; Notable for the following components:      Result Value   SARS Coronavirus 2 by RT PCR POSITIVE (*)    All other components within normal limits  CBC WITH  DIFFERENTIAL/PLATELET - Abnormal; Notable for the following components:   RBC 5.66 (*)    Hemoglobin 16.0 (*)    HCT 48.5 (*)    Lymphs Abs 0.6 (*)    All other components within normal limits  COMPREHENSIVE METABOLIC PANEL - Abnormal; Notable for the following components:   Glucose, Bld 121 (*)    Creatinine, Ser 1.10 (*)    Calcium 8.4 (*)    Albumin 3.1 (*)    AST 106 (*)    ALT 130 (*)    Alkaline Phosphatase 199 (*)    GFR, Estimated 59 (*)    All other components within normal limits  D-DIMER, QUANTITATIVE - Abnormal; Notable for the following components:   D-Dimer, Quant 1.55 (*)    All other components within normal limits  TROPONIN I (HIGH SENSITIVITY) - Abnormal; Notable for the following components:   Troponin I (High Sensitivity) 52 (*)    All other components within normal limits  LIPASE, BLOOD  CK  LACTIC ACID, PLASMA  LACTIC ACID, PLASMA  MAGNESIUM  PHOSPHORUS  PREALBUMIN  SODIUM, URINE, RANDOM  CREATININE, URINE, RANDOM  TSH  URINALYSIS, COMPLETE (UACMP) WITH MICROSCOPIC  HIV ANTIBODY (ROUTINE TESTING W REFLEX)  PROCALCITONIN  C-REACTIVE PROTEIN  FERRITIN  LACTATE DEHYDROGENASE  TROPONIN I (HIGH SENSITIVITY)  TROPONIN I (HIGH SENSITIVITY)    EKG EKG Interpretation  Date/Time:  Sunday January 12 2022 20:01:43 EST Ventricular Rate:  74 PR Interval:  127 QRS Duration: 85 QT Interval:  366 QTC Calculation: 406 R Axis:   -79 Text Interpretation: Sinus rhythm Left anterior fascicular block Abnormal R-wave progression, late transition No previous ECGs available Confirmed by Ezequiel Essex [phone]) on 12/20/2021 8:15:25 PM  Radiology CT Angio Chest PE W and/or Wo Contrast  Result Date: 01/16/2022 CLINICAL DATA:  Pulmonary embolism (PE) suspected, low to intermediate prob, positive D-dimer EXAM: CT ANGIOGRAPHY CHEST WITH CONTRAST TECHNIQUE: Multidetector CT imaging of the chest was performed using the standard protocol during bolus administration of  intravenous contrast. Multiplanar CT image reconstructions and MIPs were obtained to evaluate the vascular anatomy. RADIATION DOSE REDUCTION: This exam was performed according to the departmental dose-optimization program which includes automated exposure control, adjustment of the mA and/or kV according to patient size and/or use of iterative reconstruction technique. CONTRAST:  1m OMNIPAQUE IOHEXOL 350 MG/ML SOLN COMPARISON:  None Available. FINDINGS: Cardiovascular: Satisfactory opacification of the pulmonary arteries to the segmental level. No evidence of pulmonary embolism. Thoracic aorta is normal in course and caliber. Normal heart size. No pericardial effusion. Mediastinum/Nodes: No enlarged mediastinal, hilar, or axillary lymph nodes. Thyroid gland, trachea,  and esophagus demonstrate no significant findings. Lungs/Pleura: Mild diffuse bronchial wall thickening. Mosaic attenuation within the lung bases. Lungs are otherwise clear. No pleural effusion or pneumothorax. 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 Cholelithiasis. Musculoskeletal: No chest wall abnormality. No acute or significant osseous findings. Review of the MIP images confirms the above findings. IMPRESSION: 1. No evidence of pulmonary embolism. 2. Mild diffuse bronchial wall thickening with mosaic attenuation within the lung bases, suggestive of small airways disease/bronchiolitis. 3. 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 Nonemergent vascular surgery or interventional radiology consultation is recommended. 4. Cholelithiasis. Electronically Signed   By: Davina Poke D.O.   On: 01/19/2022 21:29   CT Head Wo Contrast  Result Date: 01/11/2022 CLINICAL DATA:  Syncope, history of COVID, fell EXAM: CT HEAD WITHOUT CONTRAST TECHNIQUE: Contiguous axial images were obtained from the base of the skull through the vertex  without intravenous contrast. RADIATION DOSE REDUCTION: This exam was performed according to the departmental dose-optimization program which includes automated exposure control, adjustment of the mA and/or kV according to patient size and/or use of iterative reconstruction technique. COMPARISON:  07/14/2020 FINDINGS: Brain: No acute infarct or hemorrhage. The lateral ventricles and midline structures are unremarkable. No acute extra-axial fluid collections. No mass effect. Vascular: No hyperdense vessel or unexpected calcification. Skull: Normal. Negative for fracture or focal lesion. Sinuses/Orbits: Mucosal thickening within the ethmoid air cells and left sphenoid sinus. Other: None. IMPRESSION: 1. No acute intracranial process. 2. Ethmoid and sphenoid sinus disease. Electronically Signed   By: Randa Ngo M.D.   On: 12/22/2021 21:25    Procedures Procedures    Medications Ordered in ED Medications  sodium chloride 0.9 % bolus 1,000 mL (has no administration in time range)    ED Course/ Medical Decision Making/ A&P                           Medical Decision Making Amount and/or Complexity of Data Reviewed Independent Historian: EMS Labs: ordered. Decision-making details documented in ED Course. Radiology: ordered and independent interpretation performed. Decision-making details documented in ED Course. ECG/medicine tests: ordered and independent interpretation performed. Decision-making details documented in ED Course.  Risk Prescription drug management. Decision regarding hospitalization.   Syncopal episode while walking to the bathroom.  Did not hit head.  No chest pain or shortness of breath.  EKG is sinus rhythm.  No prolonged QT or Brugada.  Suspect likely vasovagal episode.  Patient given some IV fluids.  Does have elevated troponin of 52 without acute ST changes.  D-dimer is positive and will screen for pulmonary embolism. Orthostatics are negative.  Her syncopal episode  sounds to be vasovagal.  Orthostatics are negative.  CT scan shows no evidence of pulmonary embolism or airspace disease but does show incidental saccular splenic artery aneurysm.  Discussed with vascular surgery Dr. Scot Dock.  He reviewed CT images.  He will arrange 53-monthfollow-up.  Discussed with patient.  Transaminitis likely secondary to COVID infection.  Patient is not orthostatic but is quite symptomatic and dizzy and lightheaded with standing.  She denies any chest pain or shortness of breath.  Will plan hydration overnight as well as trending of troponins and echocardiogram.  Given patient's elevated troponin, persistent dizziness and syncope will plan admission. Discussed with Dr. DRoel Cluck         Final Clinical Impression(s) /  ED Diagnoses Final diagnoses:  Syncope and collapse  Elevated troponin  COVID-19    Rx / DC Orders ED Discharge Orders     None         Shyne Lehrke, Annie Main, MD 01/07/2022 2229

## 2022-01-12 NOTE — H&P (Signed)
[name] XIP:[id number] DOB: 16-Apr-1965 DOA: 12/22/2021    PCP: Willeen Niece, PA   Outpatient Specialists:   NONE    Patient arrived to ER on 12/26/2021 at 1848 Referred by Attending Rancour, Annie Main, MD   Patient coming from:    home Lives  With family    Chief Complaint:   No chief complaint on file.   HPI: Laura Lawson is a 56 y.o. female with medical history significant of hypothyroidism  Presented with syncope in the setting of dehydration due to recent COVID Patient had a syncopal episode today she got up to go use the bathroom and collapsed on the floor the family at the side did not hit her head patient not on any blood thinners no injuries patient has had COVID for the past 1 week and been taking Paxlovid.  On arrival by EMS blood pressure 110/72 heart rate 90 Satting 98% on room air Has been on Paxlovid for 2 days.  Patient states that she started to feel hot sweaty nauseous as well as developed diarrhea she tried to get up and that is when she had the dizziness lightheadedness and collapse.  She has been febrile for the past few days And have had a few episodes of nonbloody diarrhea   Multiple family members have been sick.  Patient does not smoke or drink at baseline.  Reports that her symptoms of nausea vomiting diarrhea started after taking Paxlovid Patient gotten nauseous while in the emergency department right after taking Paxlovid developed bradycardia down to 50s and hypotension down to 80s transiently this is rapidly improved patient did experience sensation of needing to defecate in the middle of this event Suspect the patient has vasovagal response to nausea  Initial COVID TEST   POSITIVE,     Lab Results  Component Value Date   SARSCOV2NAA POSITIVE (A) 12/29/2021     Regarding pertinent Chronic problems:      Hypothyroidism:  Lab Results  Component Value Date   TSH 4.260 02/16/2016   on synthroid     While in ER:   Patient was given  IV fluids noted to have slightly elevated troponin 52 but no EKG changes CTA showing no PE but did showed a incidental saccular splenic artery aneurysm which was discussed with Dr. Doren Custard of vascular surgery patient is to follow-up as an outpatient patient still has some lightheadedness and requesting admission    CT HEAD Ethmoid and sphenoid sinus disease.     CTA chest -  no PE,. Mild diffuse bronchial wall thickening with mosaic attenuation within the lung bases, suggestive of small airways disease/bronchiolitis. 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  Following Medications were ordered in ER: Medications  sodium chloride 0.9 % bolus 1,000 mL (1,000 mLs Intravenous Bolus 01/07/2022 1948)  iohexol (OMNIPAQUE) 350 MG/ML injection 75 mL (75 mLs Intravenous Contrast Given 12/29/2021 2050)       ED Triage Vitals  Enc Vitals Group     BP 01/14/2022 1940 109/76     Pulse Rate 12/21/2021 1940 80     Resp 12/28/2021 2000 15     Temp 01/17/2022 1939 98.1 F (36.7 C)     Temp Source 01/02/2022 1939 Oral     SpO2 12/28/2021 1940 98 %     Weight --      Height --      Head Circumference --      Peak Flow --  Pain Score 01/16/2022 1939 0     Pain Loc --      Pain Edu? --      Excl. in [place]? --   TMAX(24)@     _________________________________________ Significant initial  Findings: Abnormal Labs Reviewed  RESP PANEL BY RT-PCR (RSV, FLU A&B, COVID)  RVPGX2 - Abnormal; Notable for the following components:      Result Value   SARS Coronavirus 2 by RT PCR POSITIVE (*)    All other components within normal limits  CBC WITH DIFFERENTIAL/PLATELET - Abnormal; Notable for the following components:   RBC 5.66 (*)    Hemoglobin 16.0 (*)    HCT 48.5 (*)    Lymphs Abs 0.6 (*)    All other components within normal limits  COMPREHENSIVE METABOLIC PANEL - Abnormal; Notable for the following components:   Glucose, Bld 121 (*)    Creatinine, Ser 1.10 (*)    Calcium 8.4 (*)     Albumin 3.1 (*)    AST 106 (*)    ALT 130 (*)    Alkaline Phosphatase 199 (*)    GFR, Estimated 59 (*)    All other components within normal limits  D-DIMER, QUANTITATIVE - Abnormal; Notable for the following components:   D-Dimer, Quant 1.55 (*)    All other components within normal limits  TROPONIN I (HIGH SENSITIVITY) - Abnormal; Notable for the following components:   Troponin I (High Sensitivity) 52 (*)    All other components within normal limits    _________________________ Troponin 56 ECG: Ordered Personally reviewed and interpreted by me showing: HR : 74 Rhythm:  Sinus rhythm Left anterior fascicular block Abnormal R-wave progression, late transition No previous ECGs  QTC 406   The recent clinical data is shown below. Vitals:   01/02/2022 2007 01/03/2022 2008 12/28/2021 2009 12/31/2021 2030  BP:    (!) 143/101  Pulse: 78 80 78 84  Resp: '15 18 18 '$ (!) 23  Temp:      TempSrc:      SpO2: 99% 99% 99% 97%    WBC     Component Value Date/Time   WBC 6.5 12/29/2021 1950   LYMPHSABS 0.6 (L) 01/11/2022 1950   LYMPHSABS 2.7 02/16/2016 1502   MONOABS 0.5 01/11/2022 1950   EOSABS 0.0 12/24/2021 1950   EOSABS 0.2 02/16/2016 1502   BASOSABS 0.0 12/27/2021 1950   BASOSABS 0.0 02/16/2016 1502      UA  ordered     Results for orders placed or performed during the hospital encounter of 12/30/2021  Resp panel by RT-PCR (RSV, Flu A&B, Covid) Anterior Nasal Swab     Status: Abnormal   Collection Time: 12/26/2021  8:00 PM   Specimen: Anterior Nasal Swab  Result Value Ref Range Status   SARS Coronavirus 2 by RT PCR POSITIVE (A) NEGATIVE Final         Influenza A by PCR NEGATIVE NEGATIVE Final   Influenza B by PCR NEGATIVE NEGATIVE Final         Resp Syncytial Virus by PCR NEGATIVE NEGATIVE Final          ___________________________________ Hospitalist was called for admission for COVID  The following Work up has been ordered so far:  Orders Placed This Encounter  Procedures    Resp panel by RT-PCR (RSV, Flu A&B, Covid) Anterior Nasal Swab   CT Angio Chest PE W and/or Wo Contrast   CT Head Wo Contrast   CBC with Differential   Comprehensive metabolic  panel   Lipase, blood   D-dimer, quantitative   Orthostatic vital signs   Cardiac monitoring   Consult to hospitalist   Airborne and Contact precautions   Pulse oximetry, continuous   EKG 12-Lead     OTHER Significant initial  Findings  labs showing:  Recent Labs  Lab 01/19/2022 1950  NA 135  K 3.8  CO2 22  GLUCOSE 121*  BUN 18  CREATININE 1.10*  CALCIUM 8.4*    Cr   Up from baseline see below Lab Results  Component Value Date   CREATININE 1.10 (H) 12/29/2021   CREATININE 0.76 02/16/2016    Recent Labs  Lab 01/18/2022 1950  AST 106*  ALT 130*  ALKPHOS 199*  BILITOT 0.8  PROT 6.8  ALBUMIN 3.1*   Lab Results  Component Value Date   CALCIUM 8.4 (L) 12/25/2021    Plt: Lab Results  Component Value Date   PLT 204 12/31/2021     COVID-19 Labs  Recent Labs    01/15/2022 1950  DDIMER 1.55*    Lab Results  Component Value Date   SARSCOV2NAA POSITIVE (A) 12/30/2021       Recent Labs  Lab 01/11/2022 1950  WBC 6.5  NEUTROABS 5.3  HGB 16.0*  HCT 48.5*  MCV 85.7  PLT 204    HG/HCT  Up from baseline see below    Component Value Date/Time   HGB 16.0 (H) 12/28/2021 1950   HGB 11.9 02/16/2016 1502   HCT 48.5 (H) 12/26/2021 1950   HCT 36.9 02/16/2016 1502   MCV 85.7 01/16/2022 1950   MCV 76 (L) 02/16/2016 1502    Recent Labs  Lab 01/14/2022 1950  LIPASE 49    CBG (last 3)  Recent Labs    12/23/2021 2320  GLUCAP 138*    Cultures: No results found for: "SDES", "SPECREQUEST", "CULT", "REPTSTATUS"   Radiological Exams on Admission: CT Angio Chest PE W and/or Wo Contrast  Result Date: 01/06/2022 CLINICAL DATA:  Pulmonary embolism (PE) suspected, low to intermediate prob, positive D-dimer EXAM: CT ANGIOGRAPHY CHEST WITH CONTRAST TECHNIQUE: Multidetector CT imaging of the chest  was performed using the standard protocol during bolus administration of intravenous contrast. Multiplanar CT image reconstructions and MIPs were obtained to evaluate the vascular anatomy. RADIATION DOSE REDUCTION: This exam was performed according to the departmental dose-optimization program which includes automated exposure control, adjustment of the mA and/or kV according to patient size and/or use of iterative reconstruction technique. CONTRAST:  43m OMNIPAQUE IOHEXOL 350 MG/ML SOLN COMPARISON:  None Available. FINDINGS: Cardiovascular: Satisfactory opacification of the pulmonary arteries to the segmental level. No evidence of pulmonary embolism. Thoracic aorta is normal in course and caliber. Normal heart size. No pericardial effusion. Mediastinum/Nodes: No enlarged mediastinal, hilar, or axillary lymph nodes. Thyroid gland, trachea, and esophagus demonstrate no significant findings. Lungs/Pleura: Mild diffuse bronchial wall thickening. Mosaic attenuation within the lung bases. Lungs are otherwise clear. No pleural effusion or pneumothorax. 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 Cholelithiasis. Musculoskeletal: No chest wall abnormality. No acute or significant osseous findings. Review of the MIP images confirms the above findings. IMPRESSION: 1. No evidence of pulmonary embolism. 2. Mild diffuse bronchial wall thickening with mosaic attenuation within the lung bases, suggestive of small airways disease/bronchiolitis. 3. 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 Nonemergent vascular surgery or interventional radiology consultation is recommended. 4. Cholelithiasis. Electronically Signed  By: Davina Poke D.O.   On: 12/25/2021 21:29   CT Head Wo Contrast  Result Date: 12/30/2021 CLINICAL DATA:  Syncope, history of COVID, fell EXAM: CT HEAD WITHOUT CONTRAST TECHNIQUE: Contiguous  axial images were obtained from the base of the skull through the vertex without intravenous contrast. RADIATION DOSE REDUCTION: This exam was performed according to the departmental dose-optimization program which includes automated exposure control, adjustment of the mA and/or kV according to patient size and/or use of iterative reconstruction technique. COMPARISON:  07/14/2020 FINDINGS: Brain: No acute infarct or hemorrhage. The lateral ventricles and midline structures are unremarkable. No acute extra-axial fluid collections. No mass effect. Vascular: No hyperdense vessel or unexpected calcification. Skull: Normal. Negative for fracture or focal lesion. Sinuses/Orbits: Mucosal thickening within the ethmoid air cells and left sphenoid sinus. Other: None. IMPRESSION: 1. No acute intracranial process. 2. Ethmoid and sphenoid sinus disease. Electronically Signed   By: Randa Ngo M.D.   On: 12/25/2021 21:25   _______________________________________________________________________________________________________ Latest  Blood pressure (!) 143/101, pulse 84, temperature 98.1 F (36.7 C), temperature source Oral, resp. rate (!) 23, SpO2 97 %.   Vitals  labs and radiology finding personally reviewed  Review of Systems:    Pertinent positives include:  Fevers, chills, fatigue,  , abdominal pain, nausea, vomiting, diarrhea,  Constitutional:  No weight loss, night sweats, weight loss  HEENT:  No headaches, Difficulty swallowing,Tooth/dental problems,Sore throat,  No sneezing, itching, ear ache, nasal congestion, post nasal drip,  Cardio-vascular:  No chest pain, Orthopnea, PND, anasarca, dizziness, palpitations.no Bilateral lower extremity swelling  GI:  No heartburn, indigestionchange in bowel habits, loss of appetite, melena, blood in stool, hematemesis Resp:  no shortness of breath at rest. No dyspnea on exertion, No excess mucus, no productive cough, No non-productive cough, No coughing up of  blood.No change in color of mucus.No wheezing. Skin:  no rash or lesions. No jaundice GU:  no dysuria, change in color of urine, no urgency or frequency. No straining to urinate.  No flank pain.  Musculoskeletal:  No joint pain or no joint swelling. No decreased range of motion. No back pain.  Psych:  No change in mood or affect. No depression or anxiety. No memory loss.  Neuro: no localizing neurological complaints, no tingling, no weakness, no double vision, no gait abnormality, no slurred speech, no confusion  All systems reviewed and apart from Laura Lawson all are negative _______________________________________________________________________________________________ Past Medical History:   Past Medical History:  Diagnosis Date   Depression    onset age 39.   Elevated BP without diagnosis of hypertension    Encounter for hematology follow-up    GERD (gastroesophageal reflux disease)    Hypothyroidism    DUE TO HASHIMOTO THYROIDITIS   Lichen planus    Lower extremity edema    Neoplasia    Numbness and tingling    Orthopnea    SOB (shortness of breath) on exertion       Past Surgical History:  Procedure Laterality Date   TUBAL LIGATION      Social History:  Ambulatory   independently      reports that she has never smoked. She has never used smokeless tobacco. She reports current alcohol use. She reports that she does not use drugs.  Family History:  Family History  Problem Relation Age of Onset   Hypertension Mother    Depression Mother    Heart disease Father        CAD/stenting   Diabetes Father  Hyperlipidemia Father    Hypertension Father    Depression Father    Cancer Sister 30       Breast cancer   ______________________________________________________________________________________________ Allergies: Allergies  Allergen Reactions   Sulfa Antibiotics Rash    HIVES, ITCHING      Prior to Admission medications   Medication Sig Start Date End Date  Taking? Authorizing Provider  levothyroxine (SYNTHROID) 50 MCG tablet Take 50 mcg by mouth daily before breakfast.    [provider]  omeprazole (PRILOSEC) 20 MG capsule Take 20 mg by mouth as needed.    [provider]    ___________________________________________________________________________________________________ Physical Exam:    12/23/2021    8:30 PM 01/11/2022    8:09 PM 12/28/2021    8:08 PM  Vitals with BMI  Systolic 350    Diastolic 093    Pulse 84 78 80    1. General:  in No  Acute distress   acutely ill -appearing 2. Psychological: Alert and   Oriented 3. Head/ENT:   Dry Mucous Membranes                          Head Non traumatic, neck supple                          Poor Dentition 4. SKIN:  decreased Skin turgor,  Skin clean Dry and intact no rash 5. Heart: Regular rate and rhythm no  Murmur, no Rub or gallop 6. Lungs:  no wheezes or crackles   7. Abdomen: Soft,  non-tender, Non distended bowel sounds present 8. Lower extremities: no clubbing, cyanosis, no  edema 9. Neurologically Grossly intact, moving all 4 extremities equally   10. MSK: Normal range of motion    Chart has been reviewed  ______________________________________________________________________________________________  Assessment/Plan 56 y.o. 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 daily  Syncope, vasovagal In the setting of dehydration will rehydrate check  orthostatics prior to discharge, obtain echogram, cycle cardiac enzymes, obtain serial EKG, obtain carotid Dopplers  Elevated troponin In the setting of COVID.  Will obtain echogram.  If continues to rise will need official cardiology consult otherwise we will continue to monitor denies any chest pain no evidence of PE on CTA no ischemic changes on EKG  COVID-19 virus infection   Supportive measures Currently no evidence of hypoxia or infiltrate although CT showing some bronchitis changes.  Splenic artery aneurysm [facility]) Will need follow-up with vascular surgeon as an outpatient in 6 months Dr. Doren Custard is aware   Other plan as per orders.  DVT prophylaxis:  Lovenox      Code Status:    Code Status: Not on file FULL CODE  as per patient  I had personally discussed CODE STATUS with patient and family    Family Communication:   Family   at  Bedside  plan of care was discussed  with   Son,    Disposition Plan:    To home once workup is complete  and patient is stable   Following barriers for discharge:                            Electrolytes corrected                                                            Afebrile, white count improving able to transition to PO antibiotics                                Consults called: none  Admission status:  ED Disposition     ED Disposition  Admit   Condition  --   [place]: [place] [[id number]]  Level of Care: Telemetry [5]  Admit to tele based on following criteria: Other see comments  Comments: syncope  May place patient in observation at [facility] or [place] if equivalent level of care is available:: No  Covid Evaluation: Confirmed COVID Positive  Diagnosis: AKI (acute kidney injury) [facility]) [[id number]]  Admitting Physician: Toy Baker [[id number]]  Attending Physician: Toy Baker [[id number]]          Obs     Level of care     tele  For 24H   Lab Results  Component Value Date    [place] (A) 01/16/2022     Precautions: admitted as  covid positive Airborne and Contact precautions    Wilfredo Canterbury 01/02/2022, 11:36 PM    [facility]     after 2 AM please page floor coverage PA If 7AM-7PM, please contact the day team taking care of the patient using [email or web address]   Patient was evaluated in the context of the global COVID-19 pandemic, which necessitated consideration that the patient might be at risk for infection with the SARS-CoV-2 virus that causes COVID-19. Institutional protocols and algorithms that pertain to the evaluation of patients at risk for COVID-19 are in a state of rapid change based on information released by regulatory bodies including the CDC and federal and state organizations. These policies and algorithms were followed during the patient's care.

## 2022-01-12 NOTE — Assessment & Plan Note (Signed)
In the setting of dehydration will rehydrate check orthostatics prior to discharge, obtain echogram, cycle cardiac enzymes, obtain serial EKG, obtain carotid Dopplers

## 2022-01-12 NOTE — ED Triage Notes (Signed)
EMS reports from home called out for syncopal episode, walking to bathroom and collapsed to floor, family at side, no head strike, no blood thinners, no obvious injuries. States has had Covid for a week, took Paxlovid.  BP 110/72 HR 90 RR 14 Sp02 98 RA  20ga RAC 358m NS enroute

## 2022-01-12 NOTE — Subjective & Objective (Signed)
Patient had a syncopal episode today she got up to go use the bathroom and collapsed on the floor the family at the side did not hit her head patient not on any blood thinners no injuries patient has had COVID for the past 1 week and been taking Paxlovid.  On arrival by EMS blood pressure 110/72 heart rate 90 Satting 98% on room air Has been on Paxlovid for 2 days.  Patient states that she started to feel hot sweaty nauseous as well as developed diarrhea she tried to get up and that is when she had the dizziness lightheadedness and collapse.  She has been febrile for the past few days And have had a few episodes of nonbloody diarrhea

## 2022-01-12 NOTE — Assessment & Plan Note (Addendum)
  Supportive measures Currently no evidence of hypoxia or infiltrate although CT showing some bronchitis changes.

## 2022-01-12 NOTE — ED Notes (Signed)
ED TO INPATIENT HANDOFF REPORT  ED Nurse Name and Phone #: Luz Brazen [phone]  S Name/Age/Gender Laura Lawson 56 y.o. female Room/Bed: WA23/WA23  Code Status   Code Status: Full Code  Home/SNF/Other Home Patient oriented to: self, place, time, and situation Is this baseline? Yes   Triage Complete: Triage complete  Chief Complaint AKI (acute kidney injury) ([place]) [N17.9]  Triage Note EMS reports from home called out for syncopal episode, walking to bathroom and collapsed to floor, family at side, no head strike, no blood thinners, no obvious injuries. States has had Covid for a week, took Paxlovid.  BP 110/72 HR 90 RR 14 Sp02 98 RA  20ga RAC 331m NS enroute   Allergies Allergies  Allergen Reactions   Sulfa Antibiotics Rash    HIVES, ITCHING     Level of Care/Admitting Diagnosis ED Disposition     ED Disposition  Admit   Condition  --   Comment  [id number]  Level of Care: Telemetry [5]  Admit to tele based on following criteria: Other see comments  Comments: syncope  [facility]:: No  Covid Evaluation: Confirmed COVID Positive  Diagnosis: AKI (acute kidney injury) ([facility]) [025427] Admitting Physician: DToy Baker[[id number]]  Attending Physician: DToy Baker[[id number]]          B Medical/Surgery History Past Medical History:  Diagnosis Date   Depression    onset age 56   Elevated BP without diagnosis of hypertension    Encounter for hematology follow-up    GERD (gastroesophageal reflux disease)    Hypothyroidism    DUE TO HASHIMOTO THYROIDITIS   Lichen planus    Lower extremity edema    Neoplasia    Numbness and tingling    Orthopnea    SOB (shortness of breath) on exertion    Past Surgical History:  Procedure Laterality Date   TUBAL LIGATION       A IV Location/Drains/Wounds Patient  Lines/Drains/Airways Status     Active Line/Drains/Airways     Name Placement date Placement time Site Days   Peripheral IV 12/29/2021 20 G Right Antecubital 01/16/2022  1900  Antecubital  less than 1            Intake/Output Last 24 hours No intake or output data in the 24 hours ending 01/01/2022 2331  Labs/Imaging Results for orders placed or performed during the hospital encounter of 12/28/2021 (from the past 48 hour(s))  CBC with Differential     Status: Abnormal   Collection Time: 12/29/2021  7:50 PM  Result Value Ref Range   WBC 6.5 4.0 - 10.5 K/uL   RBC 5.66 (H) 3.87 - 5.11 MIL/uL   Hemoglobin 16.0 (H) 12.0 - 15.0 g/dL   HCT 48.5 (H) 36.0 - 46.0 %   MCV 85.7 80.0 - 100.0 fL   MCH 28.3 26.0 - 34.0 pg   MCHC 33.0 30.0 - 36.0 g/dL   RDW 14.8 11.5 - 15.5 %   Platelets 204 150 - 400 K/uL   nRBC 0.0 0.0 - 0.2 %   Neutrophils Relative % 82 %   Neutro Abs 5.3 1.7 - 7.7 K/uL   Lymphocytes Relative 9 %   Lymphs Abs 0.6 (L) 0.7 - 4.0 K/uL   Monocytes Relative 8 %   Monocytes Absolute 0.5 0.1 - 1.0 K/uL   Eosinophils Relative 0 %   Eosinophils  Absolute 0.0 0.0 - 0.5 K/uL   Basophils Relative 0 %   Basophils Absolute 0.0 0.0 - 0.1 K/uL   Immature Granulocytes 1 %   Abs Immature Granulocytes 0.03 0.00 - 0.07 K/uL    Comment: Performed at [facility], [place] [address] [place], [place] [address]  Comprehensive metabolic panel     Status: Abnormal   Collection Time: 01/06/2022  7:50 PM  Result Value Ref Range   Sodium 135 135 - 145 mmol/L   Potassium 3.8 3.5 - 5.1 mmol/L   Chloride 104 98 - 111 mmol/L   CO2 22 22 - 32 mmol/L   Glucose, Bld 121 (H) 70 - 99 mg/dL    Comment: Glucose reference range applies only to samples taken after fasting for at least 8 hours.   BUN 18 6 - 20 mg/dL   Creatinine, Ser 1.10 (H) 0.44 - 1.00 mg/dL   Calcium 8.4 (L) 8.9 - 10.3 mg/dL   Total Protein 6.8 6.5 - 8.1 g/dL   Albumin 3.1 (L) 3.5 - 5.0 g/dL   AST 106 (H) 15 - 41 U/L   ALT  130 (H) 0 - 44 U/L   Alkaline Phosphatase 199 (H) 38 - 126 U/L   Total Bilirubin 0.8 0.3 - 1.2 mg/dL   GFR, Estimated 59 (L) >60 mL/min    Comment: (NOTE) Calculated using the CKD-EPI Creatinine Equation (2021)    Anion gap 9 5 - 15    Comment: Performed at [facility], [place] [address], [place], [place] [address]  Troponin I (High Sensitivity)     Status: Abnormal   Collection Time: 01/04/2022  7:50 PM  Result Value Ref Range   Troponin I (High Sensitivity) 52 (H) <18 ng/L    Comment: (NOTE) Elevated high sensitivity troponin I (hsTnI) values and significant  changes across serial measurements may suggest ACS but many other  chronic and acute conditions are known to elevate hsTnI results.  Refer to the "Links" section for chest pain algorithms and additional  guidance. Performed at [facility], [place] [address] [place], [place] [address]   Lipase, blood     Status: None   Collection Time: 01/01/2022  7:50 PM  Result Value Ref Range   Lipase 49 11 - 51 U/L    Comment: Performed at [facility], [place] [address] [place], [place] [address]  D-dimer, quantitative     Status: Abnormal   Collection Time: 01/15/2022  7:50 PM  Result Value Ref Range   D-Dimer, Quant 1.55 (H) 0.00 - 0.50 ug/mL-FEU    Comment: (NOTE) At the manufacturer cut-off value of 0.5 g/mL FEU, this assay has a negative predictive value of 95-100%.This assay is intended for use in conjunction with a clinical pretest probability (PTP) assessment model to exclude pulmonary embolism (PE) and deep venous thrombosis (DVT) in outpatients suspected of PE or DVT. Results should be correlated with clinical presentation. Performed at [facility], [place] [address], [place], [place] [address]   Resp panel by RT-PCR (RSV, Flu A&B, Covid) Anterior Nasal Swab     Status: Abnormal   Collection Time: 01/11/2022  8:00 PM   Specimen: Anterior Nasal Swab  Result  Value Ref Range   SARS Coronavirus 2 by RT PCR POSITIVE (A) NEGATIVE    Comment: (NOTE) SARS-CoV-2 target nucleic acids are DETECTED.  The SARS-CoV-2 RNA is generally detectable in upper respiratory specimens during the acute phase of infection. Positive results are  indicative of the presence of the identified virus, but do not rule out bacterial infection or co-infection with other pathogens not detected by the test. Clinical correlation with patient history and other diagnostic information is necessary to determine patient infection status. The expected result is Negative.  Fact Sheet for Patients: EntrepreneurPulse.com.au  Fact Sheet for Healthcare Providers: IncredibleEmployment.be  This test is not yet approved or cleared by the [place] FDA and  has been authorized for detection and/or diagnosis of SARS-CoV-2 by FDA under an Emergency Use Authorization (EUA).  This EUA will remain in effect (meaning this test can be used) for the duration of  the COVID-19 declaration under Section 564(b)(1) of the A ct, 21 U.S.C. section 360bbb-3(b)(1), unless the authorization is terminated or revoked sooner.     Influenza A by PCR NEGATIVE NEGATIVE   Influenza B by PCR NEGATIVE NEGATIVE    Comment: (NOTE) The Xpert Xpress SARS-CoV-2/FLU/RSV plus assay is intended as an aid in the diagnosis of influenza from Nasopharyngeal swab specimens and should not be used as a sole basis for treatment. Nasal washings and aspirates are unacceptable for Xpert Xpress SARS-CoV-2/FLU/RSV testing.  Fact Sheet for Patients: EntrepreneurPulse.com.au  Fact Sheet for Healthcare Providers: IncredibleEmployment.be  This test is not yet approved or cleared by the [place] FDA and has been authorized for detection and/or diagnosis of SARS-CoV-2 by FDA under an Emergency Use Authorization (EUA). This EUA will remain in effect  (meaning this test can be used) for the duration of the COVID-19 declaration under Section 564(b)(1) of the Act, 21 U.S.C. section 360bbb-3(b)(1), unless the authorization is terminated or revoked.     Resp Syncytial Virus by PCR NEGATIVE NEGATIVE    Comment: (NOTE) Fact Sheet for Patients: EntrepreneurPulse.com.au  Fact Sheet for Healthcare Providers: IncredibleEmployment.be  This test is not yet approved or cleared by the [place] FDA and has been authorized for detection and/or diagnosis of SARS-CoV-2 by FDA under an Emergency Use Authorization (EUA). This EUA will remain in effect (meaning this test can be used) for the duration of the COVID-19 declaration under Section 564(b)(1) of the Act, 21 U.S.C. section 360bbb-3(b)(1), unless the authorization is terminated or revoked.  Performed at [facility], [place] [address], [place],  [address]   CBG monitoring, ED     Status: Abnormal   Collection Time: 01/06/2022 11:20 PM  Result Value Ref Range   Glucose-Capillary 138 (H) 70 - 99 mg/dL    Comment: Glucose reference range applies only to samples taken after fasting for at least 8 hours.   CT Angio Chest PE W and/or Wo Contrast  Result Date: 12/20/2021 CLINICAL DATA:  Pulmonary embolism (PE) suspected, low to intermediate prob, positive D-dimer EXAM: CT ANGIOGRAPHY CHEST WITH CONTRAST TECHNIQUE: Multidetector CT imaging of the chest was performed using the standard protocol during bolus administration of intravenous contrast. Multiplanar CT image reconstructions and MIPs were obtained to evaluate the vascular anatomy. RADIATION DOSE REDUCTION: This exam was performed according to the departmental dose-optimization program which includes automated exposure control, adjustment of the mA and/or kV according to patient size and/or use of iterative reconstruction technique. CONTRAST:  53m OMNIPAQUE IOHEXOL 350 MG/ML SOLN  COMPARISON:  None Available. FINDINGS: Cardiovascular: Satisfactory opacification of the pulmonary arteries to the segmental level. No evidence of pulmonary embolism. Thoracic aorta is normal in course and caliber. Normal heart size. No pericardial effusion. Mediastinum/Nodes: No enlarged mediastinal, hilar, or axillary lymph nodes. Thyroid gland, trachea, and esophagus demonstrate no significant  findings. Lungs/Pleura: Mild diffuse bronchial wall thickening. Mosaic attenuation within the lung bases. Lungs are otherwise clear. No pleural effusion or pneumothorax. 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 Cholelithiasis. Musculoskeletal: No chest wall abnormality. No acute or significant osseous findings. Review of the MIP images confirms the above findings. IMPRESSION: 1. No evidence of pulmonary embolism. 2. Mild diffuse bronchial wall thickening with mosaic attenuation within the lung bases, suggestive of small airways disease/bronchiolitis. 3. 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 Nonemergent vascular surgery or interventional radiology consultation is recommended. 4. Cholelithiasis. Electronically Signed   By: Davina Poke D.O.   On: 01/15/2022 21:29   CT Head Wo Contrast  Result Date: 01/14/2022 CLINICAL DATA:  Syncope, history of COVID, fell EXAM: CT HEAD WITHOUT CONTRAST TECHNIQUE: Contiguous axial images were obtained from the base of the skull through the vertex without intravenous contrast. RADIATION DOSE REDUCTION: This exam was performed according to the departmental dose-optimization program which includes automated exposure control, adjustment of the mA and/or kV according to patient size and/or use of iterative reconstruction technique. COMPARISON:  07/14/2020 FINDINGS: Brain: No acute infarct or hemorrhage. The lateral ventricles and midline structures are unremarkable.  No acute extra-axial fluid collections. No mass effect. Vascular: No hyperdense vessel or unexpected calcification. Skull: Normal. Negative for fracture or focal lesion. Sinuses/Orbits: Mucosal thickening within the ethmoid air cells and left sphenoid sinus. Other: None. IMPRESSION: 1. No acute intracranial process. 2. Ethmoid and sphenoid sinus disease. Electronically Signed   By: Randa Ngo M.D.   On: 12/26/2021 21:25    Pending Labs Unresulted Labs (From admission, onward)     Start     Ordered   02-05-22 0500  Prealbumin  Tomorrow morning,   R        12/20/2021 2224   12/28/2021 2228  C-reactive protein  Add-on,   AD        01/06/2022 2227   12/27/2021 2228  Ferritin  Add-on,   AD        01/14/2022 2227   01/05/2022 2228  Lactate dehydrogenase  Add-on,   AD        01/10/2022 2227   01/11/2022 2227  Procalcitonin  Add-on,   AD        12/27/2021 2227   12/20/2021 2226  HIV Antibody (routine testing w rflx)  (HIV Antibody (Routine testing w reflex) panel)  Once,   URGENT        12/25/2021 2227   12/25/2021 2225  CK  Add-on,   AD        01/02/2022 2224   12/31/2021 2225  Lactic acid, plasma  STAT Now then every 3 hours,   R (with STAT occurrences)     Question:  Release to patient  Answer:  Immediate   12/21/2021 2224   01/11/2022 2225  Magnesium  Add-on,   AD        01/17/2022 2224   01/11/2022 2225  Phosphorus  Add-on,   AD        01/14/2022 2224   12/30/2021 2225  Sodium, urine, random  Once,   URGENT        01/11/2022 2224   12/23/2021 2225  Creatinine, urine, random  Once,   URGENT        12/20/2021 2224   01/09/2022 2225  TSH  Add-on,   AD  12/29/2021 2224   01/10/2022 2225  Urinalysis, Complete w Microscopic  Once,   URGENT       Question:  Release to patient  Answer:  Immediate   01/09/2022 2224   Signed and Held  Comprehensive metabolic panel  Tomorrow morning,   R       Question:  Release to patient  Answer:  Immediate   Signed and Held   Signed and Held  CBC  Tomorrow morning,   R       Question:  Release to  patient  Answer:  Immediate   Signed and Held   Signed and Held  Magnesium  Tomorrow morning,   R        Signed and Held   Signed and Held  Phosphorus  Tomorrow morning,   R        Signed and Held            Vitals/Pain Today's Vitals   12/22/2021 2030 01/10/2022 2300 01/09/2022 2315 01/14/2022 2326  BP: (!) 143/101 (!) 116/91 111/88   Pulse: 84 90 81   Resp: (!) '23 17 18   '$ Temp:    98.1 F (36.7 C)  TempSrc:      SpO2: 97% 97% 97%   PainSc:        Isolation Precautions Airborne and Contact precautions  Medications Medications  sodium chloride 0.9 % bolus 1,000 mL (1,000 mLs Intravenous Bolus 12/27/2021 1948)  iohexol (OMNIPAQUE) 350 MG/ML injection 75 mL (75 mLs Intravenous Contrast Given 01/11/2022 2050)    Mobility walks Low fall risk   Focused Assessments    R Recommendations: See Admitting Provider Note  Report given to:   Additional Notes:

## 2022-01-12 NOTE — Assessment & Plan Note (Signed)
Likely secondary to dehydration will rehydrate obtain electrolytes follow renal function

## 2022-01-13 ENCOUNTER — Observation Stay (HOSPITAL_COMMUNITY): Payer: Managed Care, Other (non HMO)

## 2022-01-13 ENCOUNTER — Inpatient Hospital Stay (HOSPITAL_COMMUNITY): Payer: Managed Care, Other (non HMO)

## 2022-01-13 DIAGNOSIS — R579 Shock, unspecified: Secondary | ICD-10-CM | POA: Diagnosis not present

## 2022-01-13 DIAGNOSIS — I959 Hypotension, unspecified: Secondary | ICD-10-CM | POA: Diagnosis present

## 2022-01-13 DIAGNOSIS — R61 Generalized hyperhidrosis: Secondary | ICD-10-CM | POA: Diagnosis present

## 2022-01-13 DIAGNOSIS — R55 Syncope and collapse: Secondary | ICD-10-CM | POA: Diagnosis present

## 2022-01-13 DIAGNOSIS — R001 Bradycardia, unspecified: Secondary | ICD-10-CM | POA: Diagnosis present

## 2022-01-13 DIAGNOSIS — U071 COVID-19: Secondary | ICD-10-CM

## 2022-01-13 DIAGNOSIS — A0839 Other viral enteritis: Secondary | ICD-10-CM | POA: Diagnosis present

## 2022-01-13 DIAGNOSIS — R197 Diarrhea, unspecified: Secondary | ICD-10-CM | POA: Diagnosis present

## 2022-01-13 DIAGNOSIS — I728 Aneurysm of other specified arteries: Secondary | ICD-10-CM | POA: Diagnosis present

## 2022-01-13 DIAGNOSIS — Z833 Family history of diabetes mellitus: Secondary | ICD-10-CM | POA: Diagnosis not present

## 2022-01-13 DIAGNOSIS — I469 Cardiac arrest, cause unspecified: Secondary | ICD-10-CM | POA: Diagnosis not present

## 2022-01-13 DIAGNOSIS — Z8249 Family history of ischemic heart disease and other diseases of the circulatory system: Secondary | ICD-10-CM | POA: Diagnosis not present

## 2022-01-13 DIAGNOSIS — F32A Depression, unspecified: Secondary | ICD-10-CM | POA: Diagnosis present

## 2022-01-13 DIAGNOSIS — E872 Acidosis, unspecified: Secondary | ICD-10-CM | POA: Diagnosis present

## 2022-01-13 DIAGNOSIS — K219 Gastro-esophageal reflux disease without esophagitis: Secondary | ICD-10-CM | POA: Diagnosis present

## 2022-01-13 DIAGNOSIS — J40 Bronchitis, not specified as acute or chronic: Secondary | ICD-10-CM | POA: Diagnosis present

## 2022-01-13 DIAGNOSIS — Z818 Family history of other mental and behavioral disorders: Secondary | ICD-10-CM | POA: Diagnosis not present

## 2022-01-13 DIAGNOSIS — Z803 Family history of malignant neoplasm of breast: Secondary | ICD-10-CM | POA: Diagnosis not present

## 2022-01-13 DIAGNOSIS — R630 Anorexia: Secondary | ICD-10-CM | POA: Diagnosis present

## 2022-01-13 DIAGNOSIS — Z83438 Family history of other disorder of lipoprotein metabolism and other lipidemia: Secondary | ICD-10-CM | POA: Diagnosis not present

## 2022-01-13 DIAGNOSIS — K7200 Acute and subacute hepatic failure without coma: Secondary | ICD-10-CM | POA: Diagnosis not present

## 2022-01-13 DIAGNOSIS — E063 Autoimmune thyroiditis: Secondary | ICD-10-CM | POA: Diagnosis present

## 2022-01-13 DIAGNOSIS — R7989 Other specified abnormal findings of blood chemistry: Secondary | ICD-10-CM | POA: Diagnosis present

## 2022-01-13 DIAGNOSIS — N179 Acute kidney failure, unspecified: Secondary | ICD-10-CM | POA: Diagnosis present

## 2022-01-13 DIAGNOSIS — E860 Dehydration: Secondary | ICD-10-CM | POA: Diagnosis present

## 2022-01-13 LAB — COMPREHENSIVE METABOLIC PANEL
ALT: 85 U/L — ABNORMAL HIGH (ref 0–44)
ALT: 82 U/L — ABNORMAL HIGH (ref 0–44)
ALT: 2398 U/L — ABNORMAL HIGH (ref 0–44)
AST: 73 U/L — ABNORMAL HIGH (ref 15–41)
AST: 71 U/L — ABNORMAL HIGH (ref 15–41)
AST: 2253 U/L — ABNORMAL HIGH (ref 15–41)
Albumin: 2.7 g/dL — ABNORMAL LOW (ref 3.5–5.0)
Albumin: 2.8 g/dL — ABNORMAL LOW (ref 3.5–5.0)
Albumin: <1.5 g/dL — ABNORMAL LOW (ref 3.5–5.0)
Alkaline Phosphatase: 164 U/L — ABNORMAL HIGH (ref 38–126)
Alkaline Phosphatase: 164 U/L — ABNORMAL HIGH (ref 38–126)
Alkaline Phosphatase: 62 U/L (ref 38–126)
Anion gap: 10 (ref 5–15)
Anion gap: 13 (ref 5–15)
Anion gap: 18 — ABNORMAL HIGH (ref 5–15)
BUN: 18 mg/dL (ref 6–20)
BUN: 21 mg/dL — ABNORMAL HIGH (ref 6–20)
BUN: 22 mg/dL — ABNORMAL HIGH (ref 6–20)
CO2: 16 mmol/L — ABNORMAL LOW (ref 22–32)
CO2: 14 mmol/L — ABNORMAL LOW (ref 22–32)
CO2: 7 mmol/L — ABNORMAL LOW (ref 22–32)
Calcium: 7.7 mg/dL — ABNORMAL LOW (ref 8.9–10.3)
Calcium: 8 mg/dL — ABNORMAL LOW (ref 8.9–10.3)
Calcium: 6.3 mg/dL — CL (ref 8.9–10.3)
Chloride: 107 mmol/L (ref 98–111)
Chloride: 108 mmol/L (ref 98–111)
Chloride: 116 mmol/L — ABNORMAL HIGH (ref 98–111)
Creatinine, Ser: 0.77 mg/dL (ref 0.44–1.00)
Creatinine, Ser: 0.94 mg/dL (ref 0.44–1.00)
Creatinine, Ser: 1.33 mg/dL — ABNORMAL HIGH (ref 0.44–1.00)
GFR, Estimated: >60 mL/min (ref >60)
GFR, Estimated: >60 mL/min (ref >60)
GFR, Estimated: 47 mL/min — ABNORMAL LOW (ref >60)
Glucose, Bld: 121 mg/dL — ABNORMAL HIGH (ref 70–99)
Glucose, Bld: 166 mg/dL — ABNORMAL HIGH (ref 70–99)
Glucose, Bld: 237 mg/dL — ABNORMAL HIGH (ref 70–99)
Potassium: 4.3 mmol/L (ref 3.5–5.1)
Potassium: 4 mmol/L (ref 3.5–5.1)
Potassium: 4.2 mmol/L (ref 3.5–5.1)
Sodium: 133 mmol/L — ABNORMAL LOW (ref 135–145)
Sodium: 135 mmol/L (ref 135–145)
Sodium: 141 mmol/L (ref 135–145)
Total Bilirubin: 0.8 mg/dL (ref 0.3–1.2)
Total Bilirubin: 0.7 mg/dL (ref 0.3–1.2)
Total Bilirubin: 0.6 mg/dL (ref 0.3–1.2)
Total Protein: 5.9 g/dL — ABNORMAL LOW (ref 6.5–8.1)
Total Protein: 6.2 g/dL — ABNORMAL LOW (ref 6.5–8.1)
Total Protein: <3 g/dL — ABNORMAL LOW (ref 6.5–8.1)

## 2022-01-13 LAB — ECHOCARDIOGRAM COMPLETE
Area-P 1/2: 2.88 cm2
S' Lateral: 2.5 cm

## 2022-01-13 LAB — FERRITIN: Ferritin: 276 ng/mL (ref 11–307)

## 2022-01-13 LAB — CBC
HCT: 56.5 % — ABNORMAL HIGH (ref 36.0–46.0)
HCT: 41.6 % (ref 36.0–46.0)
Hemoglobin: 18.6 g/dL — ABNORMAL HIGH (ref 12.0–15.0)
Hemoglobin: 12.3 g/dL (ref 12.0–15.0)
MCH: 28.1 pg (ref 26.0–34.0)
MCH: 28.8 pg (ref 26.0–34.0)
MCHC: 32.9 g/dL (ref 30.0–36.0)
MCHC: 29.6 g/dL — ABNORMAL LOW (ref 30.0–36.0)
MCV: 85.3 fL (ref 80.0–100.0)
MCV: 97.4 fL (ref 80.0–100.0)
Platelets: 186 10*3/uL (ref 150–400)
Platelets: 82 10*3/uL — ABNORMAL LOW (ref 150–400)
RBC: 6.62 MIL/uL — ABNORMAL HIGH (ref 3.87–5.11)
RBC: 4.27 MIL/uL (ref 3.87–5.11)
RDW: 15.4 % (ref 11.5–15.5)
RDW: 15.2 % (ref 11.5–15.5)
WBC: 6.5 10*3/uL (ref 4.0–10.5)
WBC: 7.1 10*3/uL (ref 4.0–10.5)
nRBC: 0 % (ref 0.0–0.2)
nRBC: 0.6 % — ABNORMAL HIGH (ref 0.0–0.2)

## 2022-01-13 LAB — PHOSPHORUS
Phosphorus: 4.3 mg/dL (ref 2.5–4.6)
Phosphorus: 4.8 mg/dL — ABNORMAL HIGH (ref 2.5–4.6)

## 2022-01-13 LAB — MAGNESIUM
Magnesium: 1.8 mg/dL (ref 1.7–2.4)
Magnesium: 1.8 mg/dL (ref 1.7–2.4)

## 2022-01-13 LAB — HIV ANTIBODY (ROUTINE TESTING W REFLEX): HIV Screen 4th Generation wRfx: NONREACTIVE

## 2022-01-13 LAB — LACTIC ACID, PLASMA
Lactic Acid, Venous: 1.7 mmol/L (ref 0.5–1.9)
Lactic Acid, Venous: 2 mmol/L (ref 0.5–1.9)

## 2022-01-13 LAB — LACTATE DEHYDROGENASE: LDH: 217 U/L — ABNORMAL HIGH (ref 98–192)

## 2022-01-13 LAB — CK: Total CK: 126 U/L (ref 38–234)

## 2022-01-13 LAB — TROPONIN I (HIGH SENSITIVITY)
Troponin I (High Sensitivity): 67 ng/L — ABNORMAL HIGH (ref <18)
Troponin I (High Sensitivity): 82 ng/L — ABNORMAL HIGH (ref <18)

## 2022-01-13 LAB — GLUCOSE, CAPILLARY: Glucose-Capillary: 201 mg/dL — ABNORMAL HIGH (ref 70–99)

## 2022-01-13 LAB — PREALBUMIN: Prealbumin: 10 mg/dL — ABNORMAL LOW (ref 18–38)

## 2022-01-13 LAB — TSH: TSH: 0.428 u[IU]/mL (ref 0.350–4.500)

## 2022-01-13 LAB — PROCALCITONIN: Procalcitonin: 0.12 ng/mL

## 2022-01-13 LAB — C-REACTIVE PROTEIN: CRP: 2.3 mg/dL — ABNORMAL HIGH (ref <1.0)

## 2022-01-13 MED ORDER — ENOXAPARIN SODIUM 30 MG/0.3ML IJ SOSY
30.0000 mg | PREFILLED_SYRINGE | INTRAMUSCULAR | Status: DC
  Filled 2022-01-13: qty 0.3

## 2022-01-13 MED ORDER — PANTOPRAZOLE SODIUM 40 MG PO TBEC
40.0000 mg | DELAYED_RELEASE_TABLET | Freq: Every day | ORAL | Status: DC
  Filled 2022-01-13: qty 1

## 2022-01-13 MED ORDER — LIOTHYRONINE SODIUM 25 MCG PO TABS
25.0000 ug | ORAL_TABLET | Freq: Every day | ORAL | Status: DC
  Filled 2022-01-13: qty 1

## 2022-01-13 MED ORDER — SODIUM BICARBONATE 8.4 % IV SOLN
INTRAVENOUS | Status: AC
  Filled 2022-01-13: qty 100

## 2022-01-13 MED ORDER — FENTANYL CITRATE (PF) 100 MCG/2ML IJ SOLN
INTRAMUSCULAR | Status: AC
  Filled 2022-01-13: qty 2

## 2022-01-13 MED ORDER — SODIUM CHLORIDE 0.9 % IV SOLN: INTRAVENOUS | Status: DC

## 2022-01-13 MED ORDER — EPINEPHRINE 1 MG/10ML IJ SOSY
PREFILLED_SYRINGE | INTRAMUSCULAR | Status: AC
  Filled 2022-01-13: qty 30

## 2022-01-13 MED ORDER — VASOPRESSIN 20 UNITS/100 ML INFUSION FOR SHOCK
INTRAVENOUS | Status: AC
  Filled 2022-01-13: qty 100

## 2022-01-13 MED ORDER — ROCURONIUM BROMIDE 10 MG/ML (PF) SYRINGE
PREFILLED_SYRINGE | INTRAVENOUS | Status: AC
  Filled 2022-01-13: qty 10

## 2022-01-13 MED ORDER — SODIUM CHLORIDE 0.9 % IV BOLUS
500.0000 mL | Freq: Once | INTRAVENOUS | Status: AC
  Administered 2022-01-13: 500 mL via INTRAVENOUS

## 2022-01-13 MED ORDER — IBUPROFEN 200 MG PO TABS: 400.0000 mg | ORAL_TABLET | ORAL | Status: DC | PRN

## 2022-01-13 MED ORDER — EPINEPHRINE 1 MG/10ML IJ SOSY
PREFILLED_SYRINGE | INTRAMUSCULAR | Status: AC
  Filled 2022-01-13: qty 20

## 2022-01-13 MED ORDER — ACETAMINOPHEN 650 MG RE SUPP: 650.0000 mg | Freq: Four times a day (QID) | RECTAL | Status: DC | PRN

## 2022-01-13 MED ORDER — HYDROCODONE-ACETAMINOPHEN 5-325 MG PO TABS: 1.0000 | ORAL_TABLET | ORAL | Status: DC | PRN

## 2022-01-13 MED ORDER — PHENYLEPHRINE HCL-NACL 20-0.9 MG/250ML-% IV SOLN
INTRAVENOUS | Status: AC
  Filled 2022-01-13: qty 250

## 2022-01-13 MED ORDER — MIDAZOLAM HCL 2 MG/2ML IJ SOLN
INTRAMUSCULAR | Status: AC
  Filled 2022-01-13: qty 4

## 2022-01-13 MED ORDER — ACETAMINOPHEN 325 MG PO TABS
650.0000 mg | ORAL_TABLET | Freq: Four times a day (QID) | ORAL | Status: DC | PRN
  Administered 2022-01-13: 650 mg via ORAL
  Filled 2022-01-13: qty 2

## 2022-01-13 MED ORDER — NOREPINEPHRINE 4 MG/250ML-% IV SOLN
2.0000 ug/min | INTRAVENOUS | Status: DC
  Administered 2022-01-13: 5 ug/min via INTRAVENOUS
  Filled 2022-01-13 (×2): qty 250

## 2022-01-13 MED ORDER — ALBUTEROL SULFATE (2.5 MG/3ML) 0.083% IN NEBU: 2.5000 mg | INHALATION_SOLUTION | RESPIRATORY_TRACT | Status: DC | PRN

## 2022-01-13 MED ORDER — SODIUM CHLORIDE 0.9 % IV SOLN: 250.0000 mL | INTRAVENOUS | Status: DC

## 2022-01-13 MED ORDER — LEVOTHYROXINE SODIUM 75 MCG PO TABS: 75.0000 ug | ORAL_TABLET | Freq: Every day | ORAL | Status: DC

## 2022-01-13 MED ORDER — SODIUM CHLORIDE 0.9 % IV BOLUS
500.0000 mL | Freq: Once | INTRAVENOUS | Status: AC | PRN
  Administered 2022-01-13: 500 mL via INTRAVENOUS

## 2022-01-13 MED ORDER — TENECTEPLASE 50 MG IV KIT: 50.0000 mg | PACK | Freq: Once | INTRAVENOUS | Status: DC

## 2022-01-13 MED FILL — Medication: Qty: 2 | Status: AC

## 2022-01-20 NOTE — TOC Initial Note (Signed)
Transition of Care [facility]) - Initial/Assessment Note    Patient Details  Name: Laura Lawson MRN: [id number] Date of Birth: 1965/12/04  [facility]) CM/SW Contact:    Leeroy Cha, RN Phone Number: 01-23-22, 9:31 AM  Clinical Narrative:                  Transition of Care [facility]) Screening Note   Patient Details  Name: Laura Lawson Date of Birth: Mar 04, 1965   Transition of [facility]) CM/SW Contact:    Leeroy Cha, RN Phone Number: 2022/01/23, 9:31 AM    Transition of Care Department [facility]) has reviewed patient and no TOC needs have been identified at this time. We will continue to monitor patient advancement through interdisciplinary progression rounds. If new patient transition needs arise, please place a TOC consult.    Expected Discharge Plan: Home/Self Care Barriers to Discharge: Continued Medical Work up   Patient Goals and CMS Choice Patient states their goals for this hospitalization and ongoing recovery are:: to return home and be well CMS Medicare.gov Compare Post Acute Care list provided to:: Patient        Expected Discharge Plan and Services   Discharge Planning Services: CM Consult   Living arrangements for the past 2 months: [place]                                      Prior Living Arrangements/Services Living arrangements for the past 2 months: Single Family Home Lives with:: Spouse Patient language and need for interpreter reviewed:: Yes Do you feel safe going back to the place where you live?: Yes            Criminal Activity/Legal Involvement Pertinent to Current Situation/Hospitalization: No - Comment as needed  Activities of Daily Living Home Assistive Devices/Equipment: None ADL Screening (condition at time of admission) Patient's cognitive ability adequate to safely complete daily activities?: Yes Is the patient deaf or have difficulty hearing?: No Does the patient have difficulty seeing,  even when wearing glasses/contacts?: No Does the patient have difficulty concentrating, remembering, or making decisions?: No Patient able to express need for assistance with ADLs?: Yes Does the patient have difficulty dressing or bathing?: Yes Independently performs ADLs?: Yes (appropriate for developmental age) Does the patient have difficulty walking or climbing stairs?: No Weakness of Legs: Both Weakness of Arms/Hands: Both  Permission Sought/Granted                  Emotional Assessment Appearance:: Appears stated age Attitude/Demeanor/Rapport: Engaged Affect (typically observed): Calm Orientation: : Oriented to Self, Oriented to Place, Oriented to  Time, Oriented to Situation Alcohol / Substance Use: Never Used Psych Involvement: No (comment)  Admission diagnosis:  Syncope and collapse [R55] Elevated troponin [R79.89] AKI (acute kidney injury) ([place]) [N17.9] COVID-19 [U07.1] Patient Active Problem List   Diagnosis Date Noted   Syncope 01-23-2022   AKI (acute kidney injury) ([place]) 01/19/2022   Elevated troponin 12/21/2021   COVID-19 virus infection 12/23/2021   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   Family history of breast cancer in first degree relative 02/18/2016   Reactive depression 02/18/2016   Gastroesophageal reflux disease without esophagitis 02/18/2016   PCP:  Willeen Niece, [place] Pharmacy:   [id number] [place], [place] [id number]: [phone]:: [phone]    Social Determinants of Health ([place]  Social History: [place]: No Food Insecurity (01-18-22)  Housing: Low Risk  (01/18/2022)  Transportation Needs: No Transportation Needs (2022-01-18)  Utilities: Not At Risk (2022-01-18)  Tobacco Use: Low Risk  (01/16/2022)   SDOH Interventions:     Readmission Risk Interventions   No data to display

## 2022-01-20 NOTE — Progress Notes (Signed)
Chaplain was paged to support family after Laura Lawson's heart stopped the first time.  Chaplain met with family after Laura Lawson's heart had stopped a second time.  Chaplain provided support to family as they were told that the medical team was unable to get her heart started again.  Laura Lawson's son wanted some privacy to spend a moment with his mom.  Chaplain then brought him her bag of belongings and provided him with patient placement card.  Her son left his phone number and would like to be kept informed as much as possible through the autopsy process.  His name and number are Laura Lawson.  [phone].    Chaplain Katy Claussen, [facility] Pager, [phone] 

## 2022-01-20 NOTE — Evaluation (Signed)
Occupational Therapy Evaluation Patient Details Name: Pihu Basil MRN: [id number] DOB: [birth year] Today's Date: January 14, 2022   History of Present Illness 57 y.o. female with medical history significant of hypothyroidism. Presented with syncope in the setting of dehydration due to recent COVID  Patient had a syncopal episode, she got up to go use the bathroom and collapsed on the floor , did not hit her headpatient has had COVID for the past 1 week and been taking Paxlovid. also reports diarrhea. Dx of dehydration.   Clinical Impression   Mrs. Avrianna Smart is a 57 year old woman who presents with generalized weakness and decreased activity tolerance resulting in a sudden decline in functional abilities. At baseline she is independent and active. Today she reports just getting back from the bathroom and being significantly "wore out." She is cold to touch and reports sweating profusely and significantly weak. She reports she was able to toilet herself and her son helped her to the bathroom. She was able to sit edge of bed with therapy for BP but unable to stand. Overall she is min assist for ADLs and ambulation. Patient will benefit from skilled OT services while in hospital to improve deficits and learn compensatory strategies as needed in order to return to PLOF.  Suspect she will recover not need any OT services at discharge. She has  assistance of family if needed. Will follow acutely.      Recommendations for follow up therapy are one component of a multi-disciplinary discharge planning process, led by the attending physician.  Recommendations may be updated based on patient status, additional functional criteria and insurance authorization.   Follow Up Recommendations  No OT follow up     Assistance Recommended at Discharge Intermittent Supervision/Assistance  Patient can return home with the following A little help with bathing/dressing/bathroom;Assistance with cooking/housework;Help with  stairs or ramp for entrance    Functional Status Assessment  Patient has had a recent decline in their functional status and demonstrates the ability to make significant improvements in function in a reasonable and predictable amount of time.  Equipment Recommendations  None recommended by OT    Recommendations for Other Services       Precautions / Restrictions Precautions Precautions: Fall Precaution Comments: passed out at home Restrictions Weight Bearing Restrictions: No      Mobility Bed Mobility Overal bed mobility: Needs Assistance Bed Mobility: Supine to Sit, Sit to Supine     Supine to sit: Min guard Sit to supine: Min assist   General bed mobility comments: Patient able to sit edge of bed to get BP but unable to stand due to poor activity tolerane and weakness    Transfers                          Balance Overall balance assessment: Mild deficits observed, not formally tested                                         ADL either performed or assessed with clinical judgement   ADL Overall ADL's : Needs assistance/impaired Eating/Feeding: Set up   Grooming: Set up;Sitting   Upper Body Bathing: Set up;Sitting   Lower Body Bathing: Sit to/from stand;Minimal assistance   Upper Body Dressing : Set up;Sitting   Lower Body Dressing: Sit to/from stand;Moderate assistance   Toilet Transfer: Regular Toilet;Minimal Print production planner  Details (indicate cue type and reason): patient ambulated to bathromo with son - min assist per report Toileting- Clothing Manipulation and Hygiene: Min guard;Sitting/lateral lean;Modified independent Toileting - Clothing Manipulation Details (indicate cue type and reason): min guard to supervision per report     Functional mobility during ADLs: Minimal assistance       Vision Patient Visual Report: No change from baseline       Perception     Praxis      Pertinent Vitals/Pain Pain  Assessment Pain Assessment: No/denies pain     Hand Dominance Right   Extremity/Trunk Assessment Upper Extremity Assessment Upper Extremity Assessment: Generalized weakness   Lower Extremity Assessment Lower Extremity Assessment: Defer to PT evaluation   Cervical / Trunk Assessment Cervical / Trunk Assessment: Normal   Communication Communication Communication: No difficulties   Cognition Arousal/Alertness: Awake/alert Behavior During Therapy: WFL for tasks assessed/performed Overall Cognitive Status: Within Functional Limits for tasks assessed                                       General Comments       Exercises     Shoulder Instructions      Home Living Family/patient expects to be discharged to:: Private residence Living Arrangements: Spouse/significant other;Children Available Help at Discharge: Available 24 hours/day   Home Access: Stairs to enter [employer] of Steps: 3   Home Layout: One level     Bathroom Shower/Tub: Walk-in shower;Tub/shower unit   Bathroom Toilet: Standard     Home Equipment: None   Additional Comments: reports being independent and active at baseline      Prior Functioning/Environment Prior Level of Function : Independent/Modified Independent                        OT Problem List: Decreased strength;Decreased activity tolerance;Decreased knowledge of use of DME or AE      OT Treatment/Interventions: Self-care/ADL training;DME and/or AE instruction;Therapeutic activities;Patient/family education    OT Goals(Current goals can be found in the care plan section) Acute Rehab OT Goals Patient Stated Goal: did not state Time For Goal Achievement: 01/27/22 Potential to Achieve Goals: Good  OT Frequency: Min 2X/week    Co-evaluation              AM-PAC OT "6 Clicks" Daily Activity     Outcome Measure Help from another person eating meals?: A Little Help from another person taking  care of personal grooming?: A Little Help from another person toileting, which includes using toliet, bedpan, or urinal?: A Little Help from another person bathing (including washing, rinsing, drying)?: A Little Help from another person to put on and taking off regular upper body clothing?: A Little Help from another person to put on and taking off regular lower body clothing?: A Lot 6 Click Score: 17   End of Session Nurse Communication: Mobility status  Activity Tolerance: Patient limited by fatigue Patient left: in bed;with call bell/phone within reach;with family/visitor present  OT Visit Diagnosis: Muscle weakness (generalized) (M62.81)                Time: 9983-3825 OT Time Calculation (min): 19 min Charges:  OT General Charges $OT Visit: 1 Visit OT Evaluation $OT Eval Low Complexity: 1 Low  Gustavo Lah, OTR/L [place]  Office [phone]   Lenward Chancellor 01-16-2022, 12:38 PM

## 2022-01-20 NOTE — Progress Notes (Addendum)
Around 1400 patient care technician notified writer of hypotensive BP of 70/30. Writer went into room, patient was found diaphoretic and she stated she was dizzy. Recheck on patients BP on other arm was 70/36. Rapid response was called. BG 201. EKG completed. See orders for new orders placed.   After bolus was completed, recheck on BP at 1521 was 70/34. Patient stated she felt like she was getting worse and was more dizzy. Provider was made aware. Dr. Wynelle Cleveland came to assess patient. Recheck on BP with provider in room was 54/30.   Patient was transferred to stepdown unit. Verbal report given to Caryl Pina, Therapist, sports.   Dewayne Hatch, RN

## 2022-01-20 NOTE — Progress Notes (Signed)
Carotid duplex has been completed.   Results can be found under chart review under CV PROC. 02-Feb-2022 8:54 AM Monnie Gudgel RVT, RDMS

## 2022-01-20 NOTE — Evaluation (Signed)
Physical Therapy Evaluation Patient Details Name: Laquinda Moller MRN: [id number] DOB: 09-15-1965 Today's Date: 01-26-2022  History of Present Illness  57 y.o. female with medical history significant of hypothyroidism. Presented with syncope in the setting of dehydration due to recent COVID  Patient had a syncopal episode, she got up to go use the bathroom and collapsed on the floor , did not hit her headpatient has had COVID for the past 1 week and been taking Paxlovid. also reports diarrhea. Dx of dehydration.  Clinical Impression  Pt admitted with above diagnosis. Pt reports she's very fatigued from recently having walked to the bathroom. She was able to perform supine to sit independently, and sat at edge of bed for ~5 minutes for orthostatics (which were negative). Pt was unable to attempt standing or ambulation 2* fatigue. Good progress expected.  Pt currently with functional limitations due to the deficits listed below (see PT Problem List). Pt will benefit from skilled PT to increase their independence and safety with mobility to allow discharge to the venue listed below.          Recommendations for follow up therapy are one component of a multi-disciplinary discharge planning process, led by the attending physician.  Recommendations may be updated based on patient status, additional functional criteria and insurance authorization.  Follow Up Recommendations Home health PT      Assistance Recommended at Discharge Intermittent Supervision/Assistance  Patient can return home with the following  A lot of help with walking and/or transfers;Assistance with cooking/housework;Help with stairs or ramp for entrance;A little help with bathing/dressing/bathroom    Equipment Recommendations Rollator (4 wheels)  Recommendations for Other Services       Functional Status Assessment Patient has had a recent decline in their functional status and demonstrates the ability to make significant  improvements in function in a reasonable and predictable amount of time.     Precautions / Restrictions Precautions Precautions: Fall Precaution Comments: passed out at home Restrictions Weight Bearing Restrictions: No      Mobility  Bed Mobility Overal bed mobility: Needs Assistance Bed Mobility: Supine to Sit, Sit to Supine     Supine to sit: Min guard Sit to supine: Min assist   General bed mobility comments: Patient able to sit edge of bed to get BP but unable to stand due to poor activity tolerance and weakness    Transfers Overall transfer level: Needs assistance Equipment used: None               General transfer comment: sat EOB 5', able to lateral scoot to HOB x 2, declined sit to stand 2* fatigue from recently walking to bathroom    Ambulation/Gait                  Stairs            Wheelchair Mobility    Modified Rankin (Stroke Patients Only)       Balance Overall balance assessment: Mild deficits observed, not formally tested                                           Pertinent Vitals/Pain Pain Assessment Pain Assessment: No/denies pain    Home Living Family/patient expects to be discharged to:: Private residence Living Arrangements: Spouse/significant other;Children Available Help at Discharge: Available 24 hours/day   Home Access: Stairs to enter   [employer]  of Steps: 3   Home Layout: One level Home Equipment: None Additional Comments: reports being independent and active at baseline    Prior Function Prior Level of Function : Independent/Modified Independent                     Hand Dominance   Dominant Hand: Right    Extremity/Trunk Assessment   Upper Extremity Assessment Upper Extremity Assessment: Defer to OT evaluation    Lower Extremity Assessment Lower Extremity Assessment: Generalized weakness    Cervical / Trunk Assessment Cervical / Trunk Assessment: Normal   Communication   Communication: No difficulties  Cognition Arousal/Alertness: Awake/alert Behavior During Therapy: WFL for tasks assessed/performed Overall Cognitive Status: Within Functional Limits for tasks assessed                                          General Comments      Exercises General Exercises - Lower Extremity Ankle Circles/Pumps: AROM, Both, 10 reps, Supine   Assessment/Plan    PT Assessment Patient needs continued PT services  PT Problem List Decreased mobility;Decreased activity tolerance;Decreased balance       PT Treatment Interventions Gait training;Therapeutic exercise;Therapeutic activities;Functional mobility training;Patient/family education    PT Goals (Current goals can be found in the Care Plan section)  Acute Rehab PT Goals Patient Stated Goal: walk on trails, ride bike PT Goal Formulation: With patient/family Time For Goal Achievement: 01/27/22 Potential to Achieve Goals: Good    Frequency Min 3X/week     Co-evaluation               AM-PAC PT "6 Clicks" Mobility  Outcome Measure Help needed turning from your back to your side while in a flat bed without using bedrails?: None Help needed moving from lying on your back to sitting on the side of a flat bed without using bedrails?: A Little Help needed moving to and from a bed to a chair (including a wheelchair)?: A Little Help needed standing up from a chair using your arms (e.g., wheelchair or bedside chair)?: A Little Help needed to walk in hospital room?: A Little Help needed climbing 3-5 steps with a railing? : A Lot 6 Click Score: 18    End of Session   Activity Tolerance: Patient limited by fatigue Patient left: in bed;with call bell/phone within reach;with family/visitor present Nurse Communication: Mobility status PT Visit Diagnosis: Difficulty in walking, not elsewhere classified (R26.2);Muscle weakness (generalized) (M62.81)    Time: 1610-9604 PT Time  Calculation (min) (ACUTE ONLY): 21 min   Charges:   PT Evaluation $PT Eval Moderate Complexity: 1 Mod          Philomena Doheny PT January 21, 2022  Acute Rehabilitation Services  Office [phone]

## 2022-01-20 NOTE — Progress Notes (Addendum)
Transferred to SDU. Another fluid bolus started and Levophed initiated but BP continued to drop. Patient began to state that she was unable to feel her legs. Dr Lupita Shutter in the room assessing her. She became unconscious and then developed bradycardia followed by PEA arrest. She was intubated, given Epinephrine and then Alteplase. She regained a pulse. I have updated her son who is at the beside. Care to be taken over by ICU team.   Debbe Odea, MD

## 2022-01-20 NOTE — Significant Event (Addendum)
Rapid Response Event Note   Reason for Call :  Hypotensive  Initial Focused Assessment:  Patient alert and oriented  x4, she states that she feels weak and achy all over. Patient is diaphoretic skin in cool to touch. Unable to obtain oral temp.  O2 Sats 96-100 on room air. Manual  B/P is 100/58. unable to obtain oral, rectal temp 100.1    2:43 PM :  bolus is done. BP  70/34, remains diaphoretic     Interventions:  Notified Dr. Wynelle Cleveland  EKG Sinus Tach  CBG  175 Bolus 500 mL  Manual B/P Rectal Temp 100.1 Motrin PRN    Plan of Care:  Monitor B/P per MEWS protocol Continue with Maintaince  Fluids Monitor accurate I & O   1623 Patient transferred to ICU     MD Notified:  Dr Wynelle Cleveland Call Time: [place] Time: [place] Time: Ruby Bradley Bostelman, RN

## 2022-01-20 NOTE — ED Provider Notes (Signed)
Department of Emergency Medicine CODE BLUE CONSULTATION    CONSULT NOTE  Chief Complaint: Cardiac arrest/unresponsive   Level V Caveat: Unresponsive  History of present illness: I was contacted by the hospital for a CODE BLUE cardiac arrest upstairs and presented to the patient's bedside.  Pt admitted with covid 19, CODE BLUE called overhead 2/2 cardiac arrest   ROS: Unable to obtain, Level V caveat  Scheduled Meds:  enoxaparin (LOVENOX) injection  30 mg Subcutaneous Q24H   EPINEPHrine       EPINEPHrine       fentaNYL       levothyroxine  75 mcg Oral QAC breakfast   liothyronine  25 mcg Oral Daily   midazolam       pantoprazole  40 mg Oral Daily   phenylephrine       rocuronium bromide       sodium bicarbonate       sodium bicarbonate       tenecteplase  50 mg Intravenous Once   vasopressin       Continuous Infusions:  sodium chloride 100 mL/hr at February 05, 2022 1514   sodium chloride     norepinephrine (LEVOPHED) Adult infusion 5 mcg/min (2022-02-05 1615)   PRN Meds:.acetaminophen **OR** acetaminophen, albuterol, EPINEPHrine, EPINEPHrine, fentaNYL, HYDROcodone-acetaminophen, ibuprofen, midazolam, ondansetron (ZOFRAN) IV, phenylephrine, rocuronium bromide, sodium bicarbonate, sodium bicarbonate, vasopressin Past Medical History:  Diagnosis Date   Depression    onset age 58.   Elevated BP without diagnosis of hypertension    Encounter for hematology follow-up    GERD (gastroesophageal reflux disease)    Hypothyroidism    DUE TO HASHIMOTO THYROIDITIS   Lichen planus    Lower extremity edema    Neoplasia    Numbness and tingling    Orthopnea    SOB (shortness of breath) on exertion    Past Surgical History:  Procedure Laterality Date   TUBAL LIGATION     Social History   Socioeconomic History   Marital status: Married    Spouse name: Not on file   Number of children: 2   Years of education: Not on file   Highest education level: Not on file  Occupational  History   Occupation: Psychologist, sport and exercise   Occupation: Psychologist, sport and exercise  Tobacco Use   Smoking status: Never   Smokeless tobacco: Never  Substance and Sexual Activity   Alcohol use: Yes    Comment: Social   Drug use: Never   Sexual activity: Not on file  Other Topics Concern   Not on file  Social History Narrative   Marital status: married x 18 years      Children: 2 children (5, 30); grandchildren 1 (63yo)      Lives: with son, husband; moved from [place]      Employment: Psychologist, sport and exercise at [employer]: none       Alcohol: 1 monthly      Drugs: none      Exercise: jogging   Social Determinants of Radio broadcast assistant Strain: Not on file  Food Insecurity: No Food Insecurity (2022-02-05)   Hunger Vital Sign    Worried About Running Out of Food in the Last Year: Never true    Ronda in the Last Year: Never true  Transportation Needs: No Transportation Needs (05-Feb-2022)   PRAPARE - Hydrologist (Medical): No    Lack of Transportation (Non-Medical): No  Physical Activity: Not on file  Stress: Not on file  Social Connections: Not on file  Intimate Partner Violence: Not on file   Allergies  Allergen Reactions   Sulfa Antibiotics Hives, Itching and Rash    Last set of Vital Signs (not current) Vitals:   01/19/22 1836 01/19/2022 1851  BP: (!) 96/44 (!) 87/58  Pulse: (!) 120 (!) 110  Resp: (!) 30 (!) 83  Temp:    SpO2: (!) 17% (!) 26%      Physical Exam Gen: unresponsive Cardiovascular: pulseless  Resp: apneic. Breath sounds equal bilaterally with bagging  Abd: nondistended  Neuro: GCS 3, unresponsive to pain  HEENT: No blood in posterior pharynx, gag reflex absent, dry mm Neck: No crepitus  Musculoskeletal: No deformity  Skin: cool, mottled   Procedure Name: Intubation Date/Time: 2022/01/19 8:20 PM  Performed by: Jeanell Sparrow, DOPre-anesthesia Checklist: Patient identified, Patient  being monitored, Emergency Drugs available, Timeout performed and Suction available Oxygen Delivery Method: Non-rebreather mask Preoxygenation: Pre-oxygenation with 100% oxygen Induction Type: Rapid sequence Ventilation: Mask ventilation without difficulty Laryngoscope Size: Glidescope Tube size: 8.0 mm Number of attempts: 1 Placement Confirmation: ETT inserted through vocal cords under direct vision, CO2 detector and Breath sounds checked- equal and bilateral Secured at: 24 cm Tube secured with: ETT holder Dental Injury: Teeth and Oropharynx as per pre-operative assessment  Difficulty Due To: Difficult Airway- due to large tongue, Difficult Airway- due to reduced neck mobility and Difficult Airway- due to limited oral opening      Medical Decision making  Called to assist with airway during cardiac arrest. Pt was intubated successfully, see procedure note.    Assessment and Plan  Remainder of excellent care per PCCM     Jeanell Sparrow, DO 01/14/22 0020

## 2022-01-20 NOTE — Progress Notes (Addendum)
PCCM code note:  Called in the room by nursing staff.  Patient transferred as of hypotension and hypoxemia.  Prior to multiple failed hypertension SpO2 reportedly 96 100% on room air.  Unclear if low oxygen related to hypoperfusion, poor pleth in the setting of hypotension.  Chest x-ray and CT A chest 12/24 reviewed and interpreted as gas trapping, no significant infiltrate or PE.  In the setting of being normotensive earlier today, not hypoxemic earlier, no blood thinners or reason for spontaneous hemorrhage, and active COVID infection, PE is high on the differential.  We could not  get blood for labs to confirm or rule out the presence of anemia to rule out hemorrhage.  Given her hypotension, peripheral pressors were started prior to my arrival.  An I/O was placed right lower extremity at my request.  She complained that she could not feel her feet or lower legs.  Sensation was not intact on exam in bilateral lower extremities.  She started slurring her speech.  Heart rate and blood pressure continued to drop.  She had agonal respirations.  Pulse check was performed and no pulse was present.  CPR was started.  CODE BLUE was started.  I requested a stat call the pharmacy for alteplase for thrombolysis of presumed massive PE.  Multiple rounds of epinephrine and bicarbonate were administered.  About 10 minutes into CODE BLUE, alteplase arrived and was administered.  Patient was intubated by ED physician.  Approximately 4 minutes later, about 14 minutes of code time, ROSC was achieved.  She can need to be hypotensive.  Max dose norepinephrine was being administered.  Vasopressin was added.  I placed a central line, this time is separate critical care time billed in this note.  During placement she remained hypotensive.  Epinephrine drip was added.  This was maxed out.  After placing a central line I discussed with family and friends at bedside.  During our discussion, she was noted to start to drop her heart rate.   Developed bradycardia.  PEA arrest again.  CPR was again started.  Multiple rounds of epinephrine and bicarb.  I confirmed presence of lung slide and B-lines on bedside ultrasound ruling out pneumothorax.  Calcium gluconate given.  ROSC was achieved approximately 10 minutes.  During the code event, phenylephrine was started at maximum dose.  She was on maximal for dose vasopressors and on the ventilator.  She continued to have drop her blood pressure.  Son was called to the bedside.  His significant other were present at the bedside during goals of care conversation.  Recommended DNR.  Measures futile in the setting of maximum dose pressors and ventilator.  Not much more we can add.  Already had given thrombolysis medications for presumed massive PE.  I think most likely is a massive PE with with worsening subsequent RV failure in the setting of positive pressure ventilation.  Or possible thrombolysis medication did not achieve goal of relieving PE blockage.  Of course, other etiologies are on the differential.  Sister was called and I again recommended DNR.  They discussed and agreed to try 1 more time for code if she were to die again.  Few minutes later, she had bradycardia and hypotension worsened from her already abysmal hypotension.  Pulse check was performed, no pulse.  CPR was started.  Multiple rounds of epinephrine and bicarbonate were given.  Eventually, code was stopped due to futility.  Patient declared dead at 48 PM.  Please refer to code sheet for  exact times of code events and further details regarding medication administration.  I spent 125 minutes of critical care time, the entirety of which was spent in the room caring for the patient and discussing goals of care.  Separately billed procedures were not included in this time.

## 2022-01-20 NOTE — Progress Notes (Signed)
Received patient as a rapid response from the floor. Pt was mottled, cyanotic, hypotensive, tachycardic, labored breathing, with pulse ox unobtainable d/t hypotension and pt extremities being cold. Started gtts and corresponded with Drs as needed. Pt went bradycardic then PEA for first code at 1656. CPR was started at 1656. CCM and triad MD in the room and directing the code. Please refer to the code sheets for code details and medication administrations. Time of death 88.

## 2022-01-20 NOTE — Death Summary Note (Signed)
DEATH SUMMARY   Patient Details  Name: Laura Lawson MRN: [id number] DOB: 1966/01/11 JKK:XFGHWE, [name], [place] Admission/Discharge Information   Admit Date:  01-18-22  Date of Death: Date of Death: 01/19/2022  Time of Death: Time of Death: 04-08-1850  Length of Stay: 1   Principle Cause of death: uncertain  Hospital Diagnoses: Principal Problem:   Syncope Active Problems:   Gastroesophageal reflux disease without esophagitis   AKI (acute kidney injury) ([place])   Elevated troponin   COVID-19 virus infection   Splenic artery aneurysm ([place])   COVID-19   Cardiac arrest, cause unspecified ([place])  This is a 57 year old female with hypothyroidism who was diagnosed with COVID-19 infection and started on Paxlovid. Presented to the hospital for a syncopal episode that was felt to be due to dehydration.  In the ED, creatinine was noted to be 1.10, she had mild elevation of her LFTs, troponin was 52.  She tested positive for coronavirus by PCR. CTA of the chest showed some mild bronchial wall thickening in the lung bases.  CT of the head was unrevealing    Assessment and Plan: Principal Problem:   Syncope - initially suspected to be secondary to dehydration and diarrhea    Active Problems:  Cardiac arrest -In the afternoon of 2023-01-20, patient's blood pressure began to drop with systolics being in the 99B.  She was given a 500 cc normal saline bolus however, BP did not improve.   - She developed a temperature of 100.5, severe diaphoresis and complained of feeling dizzy.  Another normal saline bolus was ordered and the patient was immediately transferred to the stepdown unit and started on Levophed.  Despite an ongoing normal saline fluid bolus and Levophed, blood pressure continued to drop, skin became mottled and her heart rate steadily dropped  - she lost consciousness and a pulse and CPR was started.  After multiple rounds of epinephrine, bicarbonate Ateplase and intubation she was able to  regain a pulse.   - Unfortunately her blood pressure dropped again and despite an epinephrine infusion, it did not improve.  CODE BLUE was called 2 more times but the patient did not survive. - Regarding the cause of death, this remains undetermined- the reason for her sudden drop in BP is not know - there was a suspicion that she may have a PE however, a CT scan from the day prior did not reveal a PE and she was not hypoxic prior to coding.  A 2D echo was performed at 11 AM and this was found to be normal - She was noted to have a splenic artery aneurysm on CT imaging when she presented to the ED and it is uncertain whether this suddenly ruptured.  Hemoglobin was rechecked after the first CODE BLUE and had dropped from 18.6 to 12.3 however, the patient had also received aggressive IV fluids.   - There was a suspicion that she may have had bacterial sepsis but this is a low probability-her WBC count remained normal     COVID-19 virus infection - CRP 2.3 with main symptoms of diarrhea and generalized weakness       AKI (acute kidney injury) ([place]) - Creatinine 1.0 - This has improved to 0.77 with IV fluids before rising again to 7.16   Metabolic acidosis - This may have been secondary to diarrhea   Elevated LFTs - Likely secondary to COVID infection - LFTs improved before becoming severely elevated after the CODE BLUE (likely shock liver)  Elevated troponin -Continue noted to be 52, 67 and 82-the patient never had any chest pain     Splenic artery aneurysm [facility])   The results of significant diagnostics from this hospitalization (including imaging, microbiology, ancillary and laboratory) are listed below for reference.   Significant Diagnostic Studies: DG CHEST PORT 1 VIEW  Result Date: 2022/02/03 CLINICAL DATA:  Dyspnea EXAM: PORTABLE CHEST 1 VIEW COMPARISON:  None Available. FINDINGS: Endotracheal tube is in place 12 mm above the carina. Lung volumes are small, but are symmetric  and are clear. No pneumothorax or pleural effusion. Cardiac size within normal limits when accounting for poor pulmonary insufflation. Pulmonary vascularity is normal. No acute bone abnormality. Ovoid lucency within the left chest wall may represent an object overlying the patient. IMPRESSION: 1. Endotracheal tube in place 12 mm above the carina. 2. Pulmonary hypoinflation. Electronically Signed   By: Fidela Salisbury M.D.   On: 2022/02/03 17:47   ECHOCARDIOGRAM COMPLETE  Result Date: 03-Feb-2022    ECHOCARDIOGRAM REPORT   Patient Name:   Laura Lawson Date of Exam: 02/03/2022 Medical Rec #:  [id number]    Height:       62.0 in Accession #:    [id number]   Weight:       160.0 lb Date of Birth:  Nov 17, 1965   BSA:          1.739 m Patient Age:    29 years     BP:           127/89 mmHg Patient Gender: F            HR:           101 bpm. Exam Location:  Inpatient Procedure: 2D Echo, Color Doppler and Cardiac Doppler Indications:    R55 Syncope  History:        Patient has prior history of Echocardiogram examinations, most                 recent 06/07/2021.  Sonographer:    Raquel Sarna Senior RDCS Referring Phys: [place]  Sonographer Comments: Technically difficult due to respiratory interference (COVID+ scanned upright due to dyspnea) IMPRESSIONS  1. Left ventricular ejection fraction, by estimation, is 65 to 70%. The left ventricle has normal function. The left ventricle has no regional wall motion abnormalities. There is mild concentric left ventricular hypertrophy. Left ventricular diastolic parameters are consistent with Grade I diastolic dysfunction (impaired relaxation).  2. Right ventricular systolic function is normal. The right ventricular size is normal. There is normal pulmonary artery systolic pressure.  3. The mitral valve is normal in structure. Trivial mitral valve regurgitation. No evidence of mitral stenosis.  4. The aortic valve is tricuspid. Aortic valve regurgitation is not visualized. No  aortic stenosis is present.  5. The inferior vena cava is normal in size with greater than 50% respiratory variability, suggesting right atrial pressure of 3 mmHg. FINDINGS  Left Ventricle: Left ventricular ejection fraction, by estimation, is 65 to 70%. The left ventricle has normal function. The left ventricle has no regional wall motion abnormalities. The left ventricular internal cavity size was normal in size. There is  mild concentric left ventricular hypertrophy. Left ventricular diastolic parameters are consistent with Grade I diastolic dysfunction (impaired relaxation). Right Ventricle: The right ventricular size is normal. No increase in right ventricular wall thickness. Right ventricular systolic function is normal. There is normal pulmonary artery systolic pressure. The tricuspid regurgitant velocity is 1.36 m/s, and  with an assumed right  atrial pressure of 3 mmHg, the estimated right ventricular systolic pressure is 80.9 mmHg. Left Atrium: Left atrial size was normal in size. Right Atrium: Right atrial size was normal in size. Pericardium: There is no evidence of pericardial effusion. Mitral Valve: The mitral valve is normal in structure. Trivial mitral valve regurgitation. No evidence of mitral valve stenosis. Tricuspid Valve: The tricuspid valve is normal in structure. Tricuspid valve regurgitation is trivial. No evidence of tricuspid stenosis. Aortic Valve: The aortic valve is tricuspid. Aortic valve regurgitation is not visualized. No aortic stenosis is present. Pulmonic Valve: The pulmonic valve was normal in structure. Pulmonic valve regurgitation is not visualized. No evidence of pulmonic stenosis. Aorta: The aortic root is normal in size and structure. Venous: The inferior vena cava is normal in size with greater than 50% respiratory variability, suggesting right atrial pressure of 3 mmHg. IAS/Shunts: No atrial level shunt detected by color flow Doppler.  LEFT VENTRICLE PLAX 2D LVIDd:          3.90 cm   Diastology LVIDs:         2.50 cm   LV e' medial:    5.00 cm/s LV PW:         1.30 cm   LV E/e' medial:  6.4 LV IVS:        1.30 cm   LV e' lateral:   2.94 cm/s LVOT diam:     2.00 cm   LV E/e' lateral: 10.9 LV SV:         37 LV SV Index:   21 LVOT Area:     3.14 cm  RIGHT VENTRICLE RV S prime:     11.20 cm/s TAPSE (M-mode): 1.7 cm LEFT ATRIUM             Index        RIGHT ATRIUM           Index LA diam:        3.00 cm 1.73 cm/m   RA Area:     10.70 cm LA Vol (A2C):   26.0 ml 14.95 ml/m  RA Volume:   22.00 ml  12.65 ml/m LA Vol (A4C):   28.1 ml 16.16 ml/m LA Biplane Vol: 26.7 ml 15.36 ml/m  AORTIC VALVE LVOT Vmax:   85.50 cm/s LVOT Vmean:  56.700 cm/s LVOT VTI:    0.117 m  AORTA Ao Root diam: 3.00 cm Ao Asc diam:  3.10 cm MITRAL VALVE               TRICUSPID VALVE MV Area (PHT): 2.88 cm    TR Peak grad:   7.4 mmHg MV Decel Time: 263 msec    TR Vmax:        136.00 cm/s MV E velocity: 32.10 cm/s MV A velocity: 54.80 cm/s  SHUNTS MV E/A ratio:  0.59        Systemic VTI:  0.12 m                            Systemic Diam: 2.00 cm Skeet Latch MD Electronically signed by Skeet Latch MD Signature Date/Time: 02/11/22/12:35:00 PM    Final    VAS US CAROTID  Result Date: February 11, 2022 Carotid Arterial Duplex Study Patient Name:  Laura Lawson  Date of Exam:   02/11/2022 Medical Rec #: [id number]     Accession #:    [id number] Date of Birth: 09-Feb-1965    Patient Gender: F Patient  Age:   48 years Exam Location:  [facility] Procedure:      VAS US CAROTID Referring Phys: Nyoka Lint DOUTOVA --------------------------------------------------------------------------------  Indications:       Syncope. Covid+ Risk Factors:      None. Comparison Study:  No previous exams Performing Technologist: Jody Hill RVT, RDMS  Examination Guidelines: A complete evaluation includes B-mode imaging, spectral Doppler, color Doppler, and power Doppler as needed of all accessible portions of each vessel.  Bilateral testing is considered an integral part of a complete examination. Limited examinations for reoccurring indications may be performed as noted.  Right Carotid Findings: +----------+--------+--------+--------+------------------+------------------+           PSV cm/sEDV cm/sStenosisPlaque DescriptionComments           +----------+--------+--------+--------+------------------+------------------+ CCA Prox  50      17                                intimal thickening +----------+--------+--------+--------+------------------+------------------+ CCA Distal47      20                                intimal thickening +----------+--------+--------+--------+------------------+------------------+ ICA Prox  35      18                                intimal thickening +----------+--------+--------+--------+------------------+------------------+ ICA Distal72      33                                                   +----------+--------+--------+--------+------------------+------------------+ ECA       38      12                                                   +----------+--------+--------+--------+------------------+------------------+ +----------+--------+-------+----------------+-------------------+           PSV cm/sEDV cmsDescribe        Arm Pressure (mmHG) +----------+--------+-------+----------------+-------------------+ [id number]             Multiphasic, WNL                    +----------+--------+-------+----------------+-------------------+ +---------+--------+--+--------+--+---------+ VertebralPSV cm/s35EDV cm/s16Antegrade +---------+--------+--+--------+--+---------+  Left Carotid Findings: +----------+--------+-------+--------+----------------------+------------------+           PSV cm/sEDV    StenosisPlaque Description    Comments                             cm/s                                                     +----------+--------+-------+--------+----------------------+------------------+ CCA Prox  77      28  intimal thickening +----------+--------+-------+--------+----------------------+------------------+ CCA Distal67      29             heterogenous and      intimal thickening                                  smooth                                   +----------+--------+-------+--------+----------------------+------------------+ ICA Prox  38      20                                                      +----------+--------+-------+--------+----------------------+------------------+ ICA Distal62      35                                                      +----------+--------+-------+--------+----------------------+------------------+ ECA       40      13                                                      +----------+--------+-------+--------+----------------------+------------------+ +----------+--------+--------+----------------+-------------------+           PSV cm/sEDV cm/sDescribe        Arm Pressure (mmHG) +----------+--------+--------+----------------+-------------------+ [id number]              Multiphasic, WNL                    +----------+--------+--------+----------------+-------------------+ +---------+--------+--+--------+--+---------+ VertebralPSV cm/s41EDV cm/s16Antegrade +---------+--------+--+--------+--+---------+   Summary: Right Carotid: The extracranial vessels were near-normal with only minimal wall                thickening or plaque. Left Carotid: The extracranial vessels were near-normal with only minimal wall               thickening or plaque. Vertebrals:  Bilateral vertebral arteries demonstrate antegrade flow. Subclavians: Normal flow hemodynamics were seen in bilateral subclavian              arteries. *See table(s) above for measurements and observations.  Electronically signed by  Deitra Mayo MD on 01/31/2022 at 11:58:49 AM.    Final    CT Angio Chest PE W and/or Wo Contrast  Result Date: 01/08/2022 CLINICAL DATA:  Pulmonary embolism (PE) suspected, low to intermediate prob, positive D-dimer EXAM: CT ANGIOGRAPHY CHEST WITH CONTRAST TECHNIQUE: Multidetector CT imaging of the chest was performed using the standard protocol during bolus administration of intravenous contrast. Multiplanar CT image reconstructions and MIPs were obtained to evaluate the vascular anatomy. RADIATION DOSE REDUCTION: This exam was performed according to the departmental dose-optimization program which includes automated exposure control, adjustment of the mA and/or kV according to patient size and/or use of iterative reconstruction technique. CONTRAST:  64m OMNIPAQUE IOHEXOL 350 MG/ML SOLN COMPARISON:  None Available. FINDINGS: Cardiovascular: Satisfactory opacification of the pulmonary arteries to the segmental level.  No evidence of pulmonary embolism. Thoracic aorta is normal in course and caliber. Normal heart size. No pericardial effusion. Mediastinum/Nodes: No enlarged mediastinal, hilar, or axillary lymph nodes. Thyroid gland, trachea, and esophagus demonstrate no significant findings. Lungs/Pleura: Mild diffuse bronchial wall thickening. Mosaic attenuation within the lung bases. Lungs are otherwise clear. No pleural effusion or pneumothorax. 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 Cholelithiasis. Musculoskeletal: No chest wall abnormality. No acute or significant osseous findings. Review of the MIP images confirms the above findings. IMPRESSION: 1. No evidence of pulmonary embolism. 2. Mild diffuse bronchial wall thickening with mosaic attenuation within the lung bases, suggestive of small airways disease/bronchiolitis. 3. 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 Nonemergent  vascular surgery or interventional radiology consultation is recommended. 4. Cholelithiasis. Electronically Signed   By: Davina Poke D.O.   On: 01/02/2022 21:29   CT Head Wo Contrast  Result Date: 01/15/2022 CLINICAL DATA:  Syncope, history of COVID, fell EXAM: CT HEAD WITHOUT CONTRAST TECHNIQUE: Contiguous axial images were obtained from the base of the skull through the vertex without intravenous contrast. RADIATION DOSE REDUCTION: This exam was performed according to the departmental dose-optimization program which includes automated exposure control, adjustment of the mA and/or kV according to patient size and/or use of iterative reconstruction technique. COMPARISON:  07/14/2020 FINDINGS: Brain: No acute infarct or hemorrhage. The lateral ventricles and midline structures are unremarkable. No acute extra-axial fluid collections. No mass effect. Vascular: No hyperdense vessel or unexpected calcification. Skull: Normal. Negative for fracture or focal lesion. Sinuses/Orbits: Mucosal thickening within the ethmoid air cells and left sphenoid sinus. Other: None. IMPRESSION: 1. No acute intracranial process. 2. Ethmoid and sphenoid sinus disease. Electronically Signed   By: Randa Ngo M.D.   On: 01/08/2022 21:25    Microbiology: Recent Results (from the past 240 hour(s))  Resp panel by RT-PCR (RSV, Flu A&B, Covid) Anterior Nasal Swab     Status: Abnormal   Collection Time: 01/11/2022  8:00 PM   Specimen: Anterior Nasal Swab  Result Value Ref Range Status   SARS Coronavirus 2 by RT PCR POSITIVE (A) NEGATIVE Final    Comment: (NOTE) SARS-CoV-2 target nucleic acids are DETECTED.  The SARS-CoV-2 RNA is generally detectable in upper respiratory specimens during the acute phase of infection. Positive results are indicative of the presence of the identified virus, but do not rule out bacterial infection or co-infection with other pathogens not detected by the test. Clinical correlation with patient  history and other diagnostic information is necessary to determine patient infection status. The expected result is Negative.  Fact Sheet for Patients: EntrepreneurPulse.com.au  Fact Sheet for Healthcare Providers: IncredibleEmployment.be  This test is not yet approved or cleared by the [place] FDA and  has been authorized for detection and/or diagnosis of SARS-CoV-2 by FDA under an Emergency Use Authorization (EUA).  This EUA will remain in effect (meaning this test can be used) for the duration of  the COVID-19 declaration under Section 564(b)(1) of the A ct, 21 U.S.C. section 360bbb-3(b)(1), unless the authorization is terminated or revoked sooner.     Influenza A by PCR NEGATIVE NEGATIVE Final   Influenza B by PCR NEGATIVE NEGATIVE Final    Comment: (NOTE) The Xpert Xpress SARS-CoV-2/FLU/RSV plus assay is intended as an aid in the diagnosis of influenza from Nasopharyngeal swab specimens and should not be used as a sole basis for  treatment. Nasal washings and aspirates are unacceptable for Xpert Xpress SARS-CoV-2/FLU/RSV testing.  Fact Sheet for Patients: EntrepreneurPulse.com.au  Fact Sheet for Healthcare Providers: IncredibleEmployment.be  This test is not yet approved or cleared by the [place] FDA and has been authorized for detection and/or diagnosis of SARS-CoV-2 by FDA under an Emergency Use Authorization (EUA). This EUA will remain in effect (meaning this test can be used) for the duration of the COVID-19 declaration under Section 564(b)(1) of the Act, 21 U.S.C. section 360bbb-3(b)(1), unless the authorization is terminated or revoked.     Resp Syncytial Virus by PCR NEGATIVE NEGATIVE Final    Comment: (NOTE) Fact Sheet for Patients: EntrepreneurPulse.com.au  Fact Sheet for Healthcare Providers: IncredibleEmployment.be  This test is not yet  approved or cleared by the [place] FDA and has been authorized for detection and/or diagnosis of SARS-CoV-2 by FDA under an Emergency Use Authorization (EUA). This EUA will remain in effect (meaning this test can be used) for the duration of the COVID-19 declaration under Section 564(b)(1) of the Act, 21 U.S.C. section 360bbb-3(b)(1), unless the authorization is terminated or revoked.  Performed at [facility], [place] [address], [place], [place] [address]     Time spent: 35 minutes  Signed: Debbe Odea, MD Feb 04, 2022

## 2022-01-20 NOTE — Progress Notes (Signed)
Echocardiogram 2D Echocardiogram has been performed.  Oneal Deputy Trinette Vera RDCS 2022-01-26, 11:39 AM

## 2022-01-20 NOTE — Procedures (Signed)
Central Venous Catheter Insertion Procedure Note  Laura Lawson  [id number]  03-04-1965  Date:January 19, 2022  Time:7:23 PM   Provider Performing:Mohd. Derflinger R Chay Mazzoni   Procedure: Insertion of Non-tunneled Central Venous Catheter(36556) without US guidance  Indication(s) Medication administration  Consent Unable to obtain consent due to emergent nature of procedure.  Anesthesia Topical only with 1% lidocaine   Timeout Verified patient identification, verified procedure, site/side was marked, verified correct patient position, special equipment/implants available, medications/allergies/relevant history reviewed, required imaging and test results available.  Sterile Technique Maximal sterile technique including full sterile barrier drape, hand hygiene, sterile gown, sterile gloves, mask, hair covering, sterile ultrasound probe cover (if used).  Procedure Description Area of catheter insertion was cleaned with chlorhexidine and draped in sterile fashion.  With real-time ultrasound guidance a central venous catheter was placed into the right internal jugular vein. Nonpulsatile blood flow and easy flushing noted in all ports.  The catheter was sutured in place and sterile dressing applied.  Complications/Tolerance None; patient tolerated the procedure well.  EBL Minimal  Specimen(s) None

## 2022-01-20 NOTE — Progress Notes (Signed)
[facility] Progress Note  Patient: Laura Lawson     QMV:[id number]  DOA: 01/02/2022   PCP: Willeen Niece, PA       Brief hospital course: This is a 57 year old female with hypothyroidism who was diagnosed with COVID-19 infection and started on Paxlovid. Presented to the hospital for a syncopal episode that was felt to be due to dehydration.  In the ED, creatinine was noted to be 1.10, she had mild elevation of her LFTs, troponin was 52.  She tested positive for coronavirus by PCR. CTA of the chest showed some mild bronchial wall thickening in the lung bases. - CT of the head was unrevealing  Subjective:  She feels extremely weak.  She has not had any breakfast this morning because of a poor appetite. Assessment and Plan: Principal Problem:   Syncope -Due to dehydration and diarrhea - Oral intake remains poor and she is having diarrhea - Will continue IV fluids until symptoms resolve and oral intake improves  Active Problems:    COVID-19 virus infection - CRP is 2.3  - Her main symptoms are diarrhea and generalized weakness       AKI (acute kidney injury) ([place]) - Creatinine 1.0 - This has improved to 0.77 with IV fluids  [place] acidosis - Bicarb is 16 today - This may be secondary to diarrhea  Elevated LFTs - Likely secondary to COVID infection - Have improved today - Follow    Elevated troponin -Continue noted to be 52, 67 and 82 - Also likely secondary to underlying infection with COVID - Will repeat level    Splenic artery aneurysm (HCC)     Code Status: Full Code Consultants: None Level of Care: Level of care: Telemetry Total time on patient care: 30 minutes DVT prophylaxis:  enoxaparin (LOVENOX) injection 30 mg Start: 02-08-2022 1000     Objective:   Vitals:   February 08, 2022 0000 08-Feb-2022 0015 08-Feb-2022 0049 Feb 08, 2022 0528  BP: (!) 118/91 (!) 117/91 107/89 127/89  Pulse: 90 83 92 95  Resp: 20 (!) '22 20 20  '$ Temp:   99.2 F (37.3 C) 98.7 F (37.1  C)  TempSrc:      SpO2: 94% 95% 99% 97%   There were no vitals filed for this visit. Exam: General exam: Appears comfortable  HEENT: oral mucosa moist Respiratory system: Clear to auscultation.  Cardiovascular system: S1 & S2 heard  Gastrointestinal system: Abdomen soft, non-tender, nondistended. Normal bowel sounds   Extremities: No cyanosis, clubbing or edema Psychiatry:  Mood & affect appropriate.      CBC: Recent Labs  Lab 01/02/2022 1950 2022-02-08 0717  WBC 6.5 6.5  NEUTROABS 5.3  --   HGB 16.0* 18.6*  HCT 48.5* 56.5*  MCV 85.7 85.3  PLT 204 284   Basic Metabolic Panel: Recent Labs  Lab 01/02/2022 1950 12/26/2021 2344 08-Feb-2022 0717  NA 135  --  133*  K 3.8  --  4.3  CL 104  --  107  CO2 22  --  16*  GLUCOSE 121*  --  121*  BUN 18  --  18  CREATININE 1.10*  --  0.77  CALCIUM 8.4*  --  7.7*  MG  --  1.8 1.8  PHOS  --  4.8* 4.3   GFR: CrCl cannot be calculated (Unknown ideal weight.).  Scheduled Meds:  enoxaparin (LOVENOX) injection  30 mg Subcutaneous Q24H   levothyroxine  75 mcg Oral QAC breakfast   liothyronine  25 mcg Oral Daily   pantoprazole  40 mg Oral Daily   Continuous Infusions:  sodium chloride     Imaging and lab data was personally reviewed CT Angio Chest PE W and/or Wo Contrast  Result Date: 01/10/2022 CLINICAL DATA:  Pulmonary embolism (PE) suspected, low to intermediate prob, positive D-dimer EXAM: CT ANGIOGRAPHY CHEST WITH CONTRAST TECHNIQUE: Multidetector CT imaging of the chest was performed using the standard protocol during bolus administration of intravenous contrast. Multiplanar CT image reconstructions and MIPs were obtained to evaluate the vascular anatomy. RADIATION DOSE REDUCTION: This exam was performed according to the departmental dose-optimization program which includes automated exposure control, adjustment of the mA and/or kV according to patient size and/or use of iterative reconstruction technique. CONTRAST:  12m OMNIPAQUE  IOHEXOL 350 MG/ML SOLN COMPARISON:  None Available. FINDINGS: Cardiovascular: Satisfactory opacification of the pulmonary arteries to the segmental level. No evidence of pulmonary embolism. Thoracic aorta is normal in course and caliber. Normal heart size. No pericardial effusion. Mediastinum/Nodes: No enlarged mediastinal, hilar, or axillary lymph nodes. Thyroid gland, trachea, and esophagus demonstrate no significant findings. Lungs/Pleura: Mild diffuse bronchial wall thickening. Mosaic attenuation within the lung bases. Lungs are otherwise clear. No pleural effusion or pneumothorax. 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 Cholelithiasis. Musculoskeletal: No chest wall abnormality. No acute or significant osseous findings. Review of the MIP images confirms the above findings. IMPRESSION: 1. No evidence of pulmonary embolism. 2. Mild diffuse bronchial wall thickening with mosaic attenuation within the lung bases, suggestive of small airways disease/bronchiolitis. 3. 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 Nonemergent vascular surgery or interventional radiology consultation is recommended. 4. Cholelithiasis. Electronically Signed   By: NDavina PokeD.O.   On: 01/08/2022 21:29   CT Head Wo Contrast  Result Date: 01/14/2022 CLINICAL DATA:  Syncope, history of COVID, fell EXAM: CT HEAD WITHOUT CONTRAST TECHNIQUE: Contiguous axial images were obtained from the base of the skull through the vertex without intravenous contrast. RADIATION DOSE REDUCTION: This exam was performed according to the departmental dose-optimization program which includes automated exposure control, adjustment of the mA and/or kV according to patient size and/or use of iterative reconstruction technique. COMPARISON:  07/14/2020 FINDINGS: Brain: No acute infarct or hemorrhage. The lateral ventricles and midline  structures are unremarkable. No acute extra-axial fluid collections. No mass effect. Vascular: No hyperdense vessel or unexpected calcification. Skull: Normal. Negative for fracture or focal lesion. Sinuses/Orbits: Mucosal thickening within the ethmoid air cells and left sphenoid sinus. Other: None. IMPRESSION: 1. No acute intracranial process. 2. Ethmoid and sphenoid sinus disease. Electronically Signed   By: MRanda NgoM.D.   On: 01/19/2022 21:25    LOS: 0 days   Author: SDebbe Odea [id number] AM  To contact [facility]>   Check the care team in [facility] look for the attending/consulting [place] listed  Log into [email or web address] and use [facility] universal password   Go to> "[facility]"  and find provider  If you still have difficulty reaching the provider, please page the [facility](Director on Call) for the Hospitalists listed on amion

## 2022-01-20 DEATH — deceased

## 2023-04-24 IMAGING — MR MR HEAD WO/W CM
13 series · 48 of 48 positions shown · IV contrast (13ml Multihance)
Comparison: None.

CLINICAL DATA: Left-sided facial numbness

EXAM:
MRI HEAD WITHOUT AND WITH CONTRAST
TECHNIQUE: Multiplanar, multiecho pulse sequences of the brain and surrounding
structures were obtained without and with intravenous contrast.
CONTRAST:  13mL MULTIHANCE GADOBENATE DIMEGLUMINE 529 MG/ML IV SOLN

[Series 2: T1 · sagittal · 5.0mm · 0.45mm/px · 2 of 25 slices shown]
[im 1/25]
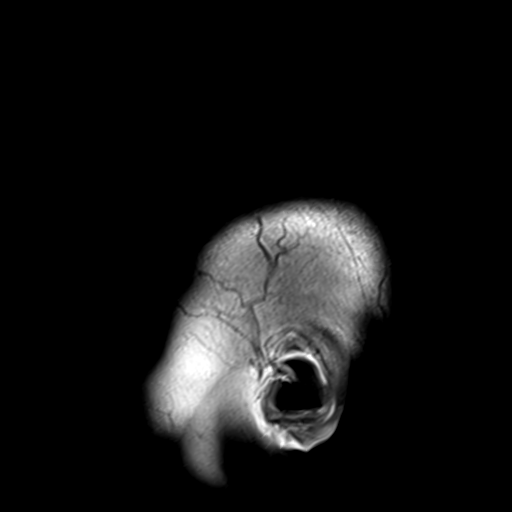
[im 25/25]
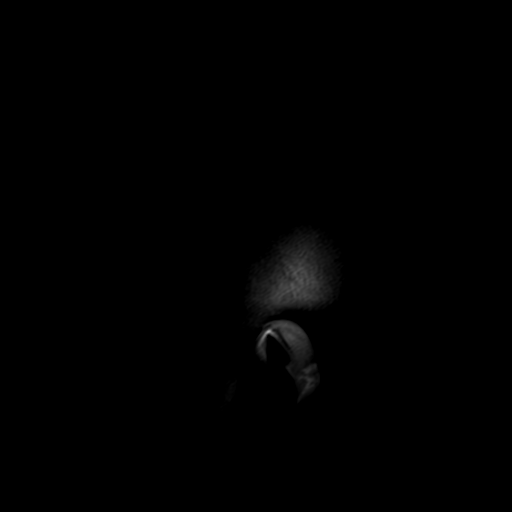

[Series 3: ax ep2d_diff_3 · axial · 3.0mm · 1.80mm/px · z∈[-79,+83]mm · 6 of 107 slices shown]
[im 1/107]
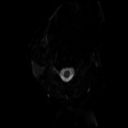
[im 22/107]
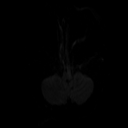
[im 43/107]
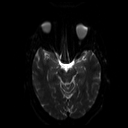
[im 64/107]
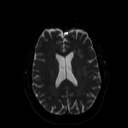
[im 85/107]
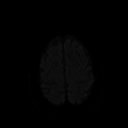
[im 107/107]
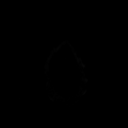

[Series 4: ax ep2d_diff_3_adc · axial · 3.0mm · 1.80mm/px · z∈[-79,+83]mm · 3 of 55 slices shown]
[im 1/55]
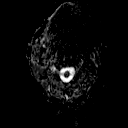
[im 28/55]
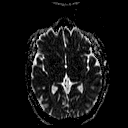
[im 55/55]
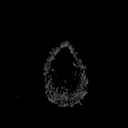

[Series 5: cor ep2d_diff · coronal · 5.0mm · 1.77mm/px · 3 of 60 slices shown]
[im 1/60]
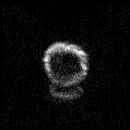
[im 30/60]
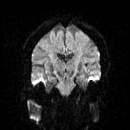
[im 60/60]
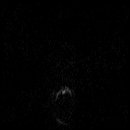

[Series 6: cor ep2d_diff_adc · coronal · 5.0mm · 1.77mm/px · 2 of 30 slices shown]
[im 1/30]
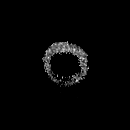
[im 30/30]
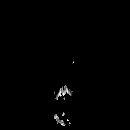

[Series 8: swi_images · axial · 2.0mm · 0.98mm/px · z∈[-76,+81]mm · 5 of 80 slices shown]
[im 1/80]
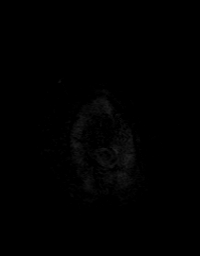
[im 20/80]
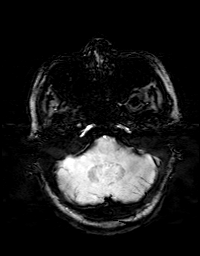
[im 40/80]
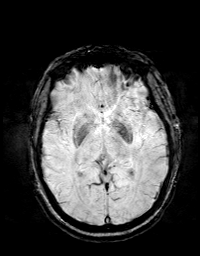
[im 60/80]
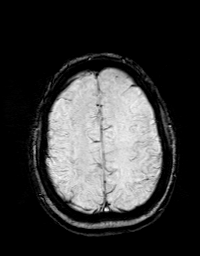
[im 80/80]
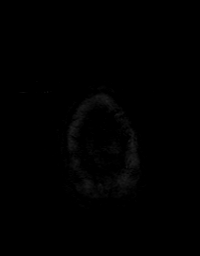

[Series 10: T2 · axial · 5.0mm · 0.65mm/px · z∈[-81,+86]mm · 2 of 29 slices shown (1 of 2)]
[im 1/29]
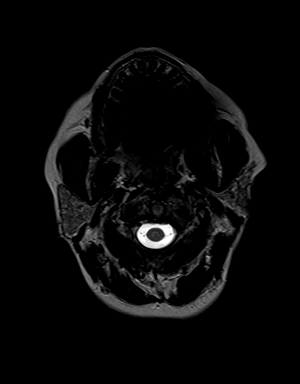
[im 29/29]
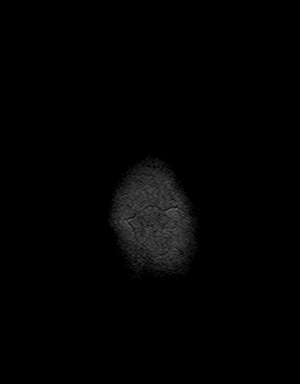

[Series 11: t1_mpr_tra · axial · 1.0mm · 0.72mm/px · z∈[-77,+82]mm · 9 of 160 slices shown]
[im 1/160]
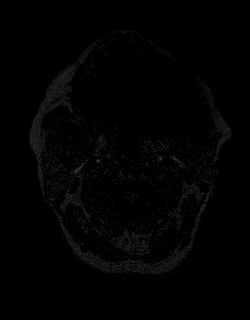
[im 20/160]
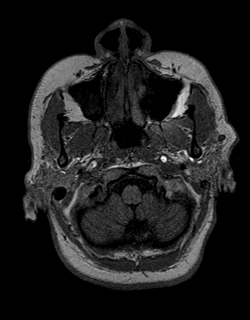
[im 40/160]
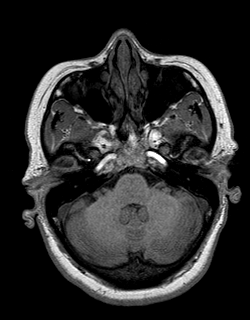
[im 60/160]
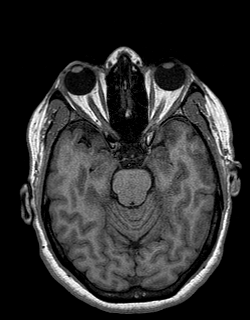
[im 80/160]
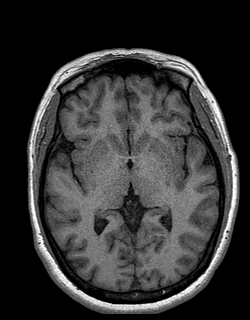
[im 100/160]
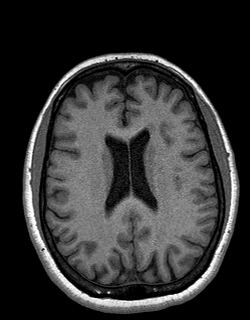
[im 120/160]
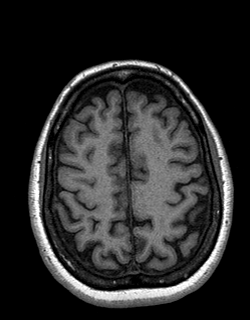
[im 140/160]
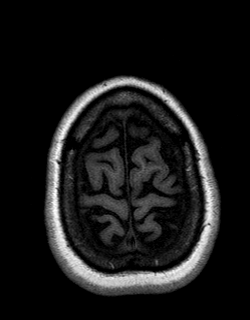
[im 160/160]
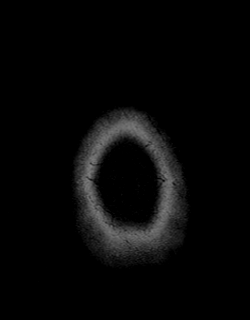

[Series 12: FLAIR · axial · 3.0mm · 0.43mm/px · z∈[-74,+78]mm · 2 of 40 slices shown]
[im 1/40]
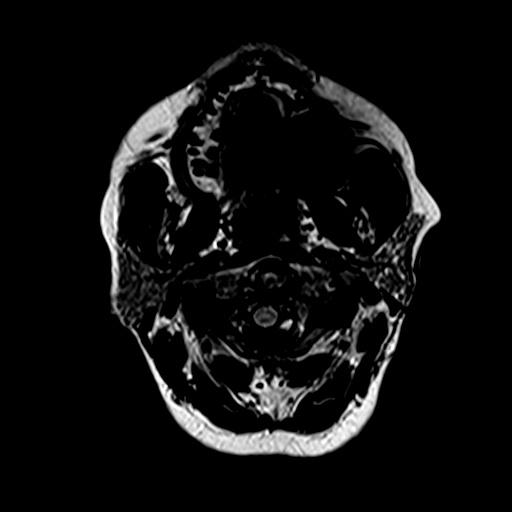
[im 40/40]
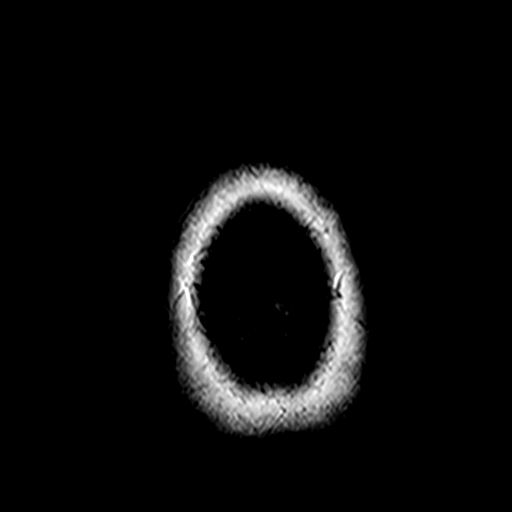

[Series 13: T2 · coronal · 5.0mm · 0.45mm/px · 2 of 31 slices shown (2 of 2)]
[im 1/31]
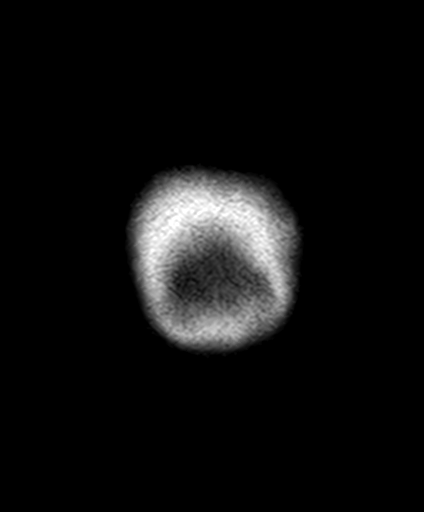
[im 31/31]
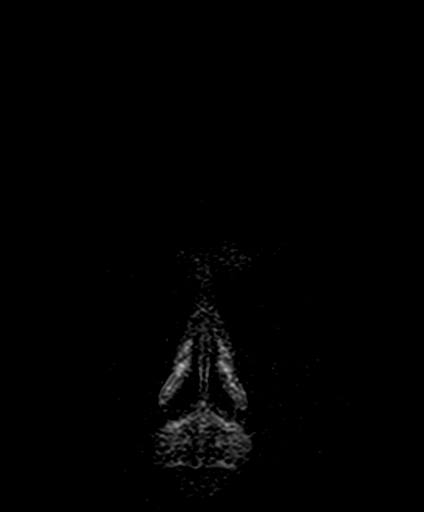

[Series 14: T1 post-contrast · coronal · 5.0mm · 0.72mm/px · 2 of 26 slices shown]
[im 1/26]
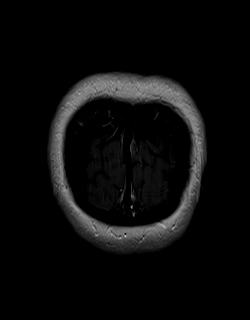
[im 26/26]
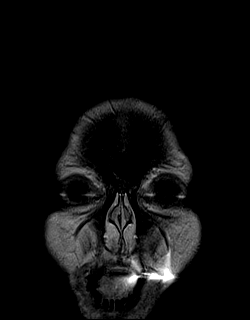

[Series 15: post t1_mpr_tra · axial · 1.0mm · 0.72mm/px · z∈[-77,+82]mm · 9 of 160 slices shown]
[im 1/160]
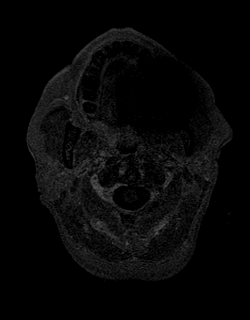
[im 20/160]
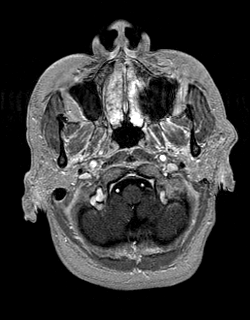
[im 40/160]
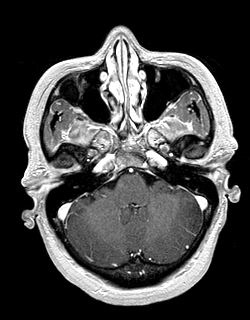
[im 60/160]
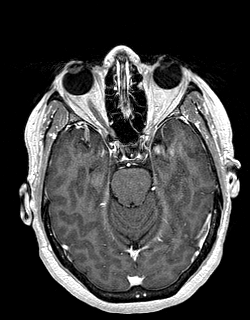
[im 80/160]
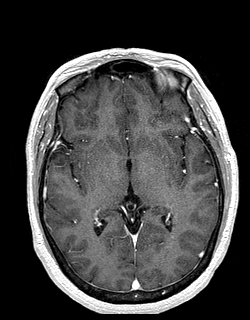
[im 100/160]
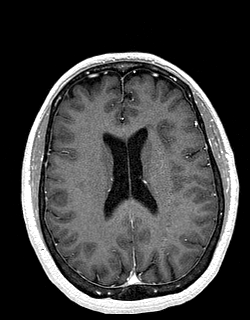
[im 120/160]
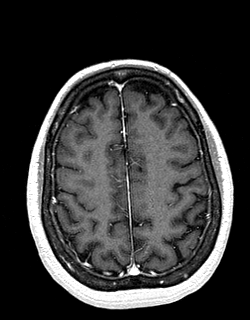
[im 140/160]
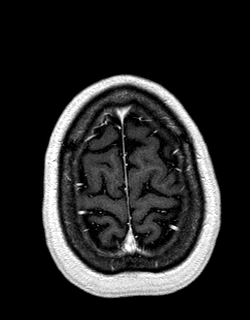
[im 160/160]
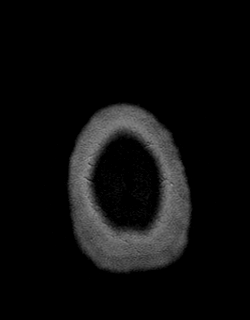

[Series 16: t1_se_sag post · sagittal · 5.0mm · 0.45mm/px · 1 of 25 slices shown]
[im 1/25]
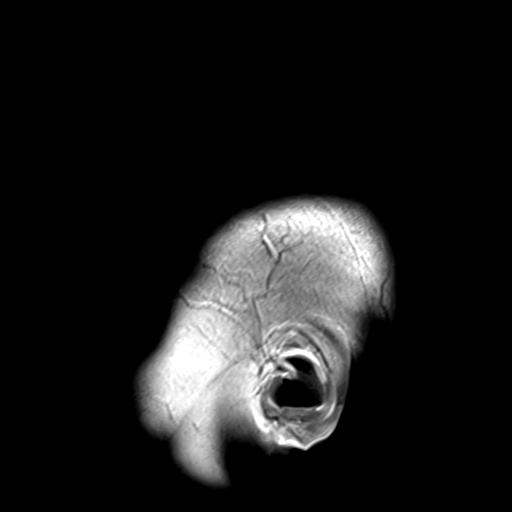

[48 of 48 positions shown; findings below may reference images not displayed]

FINDINGS: Brain: There is no acute infarction or intracranial hemorrhage.
There is no intracranial mass, mass effect, or edema. There is no
hydrocephalus or extra-axial fluid collection. Minimal patchy T2
hyperintensity in the supratentorial white matter likely reflecting
nonspecific gliosis/demyelination typically without clinical
significance. Ventricles and sulci are within normal limits in size
and configuration. No abnormal enhancement.

Vascular: Major vessel flow voids at the skull base are preserved.

Skull and upper cervical spine: Normal marrow signal is preserved.

Sinuses/Orbits: Minor mucosal thickening.  Orbits are unremarkable.

Other: Sella is unremarkable.  Mastoid air cells are clear.
IMPRESSION: No evidence of recent infarction, hemorrhage, mass, or abnormal
enhancement.
